# Patient Record
Sex: Male | Born: 1970 | Race: White | Hispanic: No | Marital: Married | Smoking: Former smoker
Health system: Southern US, Community
[De-identification: ages and names within clinical notes are randomized; demographics above are authoritative.]

## PROBLEM LIST (undated history)

## (undated) DIAGNOSIS — I7300 Raynaud's syndrome without gangrene: Secondary | ICD-10-CM

## (undated) DIAGNOSIS — I2510 Atherosclerotic heart disease of native coronary artery without angina pectoris: Secondary | ICD-10-CM

## (undated) DIAGNOSIS — E785 Hyperlipidemia, unspecified: Secondary | ICD-10-CM

## (undated) DIAGNOSIS — C801 Malignant (primary) neoplasm, unspecified: Secondary | ICD-10-CM

## (undated) DIAGNOSIS — C8590 Non-Hodgkin lymphoma, unspecified, unspecified site: Secondary | ICD-10-CM

## (undated) DIAGNOSIS — C181 Malignant neoplasm of appendix: Secondary | ICD-10-CM

## (undated) DIAGNOSIS — I739 Peripheral vascular disease, unspecified: Secondary | ICD-10-CM

## (undated) DIAGNOSIS — I10 Essential (primary) hypertension: Secondary | ICD-10-CM

## (undated) DIAGNOSIS — K5090 Crohn's disease, unspecified, without complications: Secondary | ICD-10-CM

## (undated) DIAGNOSIS — K219 Gastro-esophageal reflux disease without esophagitis: Secondary | ICD-10-CM

## (undated) DIAGNOSIS — B009 Herpesviral infection, unspecified: Secondary | ICD-10-CM

## (undated) HISTORY — PX: EXPLORATORY LAPAROTOMY: SUR591

## (undated) HISTORY — PX: SHOULDER ARTHROSCOPY W/ SUPERIOR LABRAL ANTERIOR POSTERIOR REPAIR: SHX2403

## (undated) HISTORY — DX: Hyperlipidemia, unspecified: E78.5

## (undated) HISTORY — DX: Atherosclerotic heart disease of native coronary artery without angina pectoris: I25.10

## (undated) HISTORY — DX: Gastro-esophageal reflux disease without esophagitis: K21.9

## (undated) HISTORY — PX: REFRACTIVE SURGERY: SHX103

---

## 2000-07-17 ENCOUNTER — Ambulatory Visit (HOSPITAL_COMMUNITY): Admission: RE | Admit: 2000-07-17 | Discharge: 2000-07-17 | Payer: Self-pay | Admitting: Orthopedic Surgery

## 2000-07-17 ENCOUNTER — Encounter: Payer: Self-pay | Admitting: Orthopedic Surgery

## 2009-06-04 ENCOUNTER — Ambulatory Visit: Payer: Self-pay | Admitting: Diagnostic Radiology

## 2009-06-04 ENCOUNTER — Emergency Department (HOSPITAL_BASED_OUTPATIENT_CLINIC_OR_DEPARTMENT_OTHER): Admission: EM | Admit: 2009-06-04 | Discharge: 2009-06-04 | Payer: Self-pay | Admitting: Emergency Medicine

## 2009-06-04 ENCOUNTER — Ambulatory Visit: Payer: Self-pay | Admitting: Vascular Surgery

## 2011-01-06 LAB — URINALYSIS, ROUTINE W REFLEX MICROSCOPIC
Bilirubin Urine: NEGATIVE
Glucose, UA: NEGATIVE mg/dL
Hgb urine dipstick: NEGATIVE
Ketones, ur: NEGATIVE mg/dL
Nitrite: NEGATIVE
Protein, ur: NEGATIVE mg/dL
Specific Gravity, Urine: 1.008 (ref 1.005–1.030)
Urobilinogen, UA: 0.2 mg/dL (ref 0.0–1.0)
pH: 5.5 (ref 5.0–8.0)

## 2011-01-06 LAB — CBC
HCT: 46.6 % (ref 39.0–52.0)
Hemoglobin: 16.1 g/dL (ref 13.0–17.0)
MCHC: 34.5 g/dL (ref 30.0–36.0)
MCV: 89 fL (ref 78.0–100.0)
Platelets: 349 10*3/uL (ref 150–400)
RBC: 5.24 MIL/uL (ref 4.22–5.81)
RDW: 12.3 % (ref 11.5–15.5)
WBC: 9.3 10*3/uL (ref 4.0–10.5)

## 2011-01-06 LAB — COMPREHENSIVE METABOLIC PANEL
ALT: 36 U/L (ref 0–53)
AST: 33 U/L (ref 0–37)
Albumin: 4.4 g/dL (ref 3.5–5.2)
Alkaline Phosphatase: 66 U/L (ref 39–117)
BUN: 14 mg/dL (ref 6–23)
CO2: 28 mEq/L (ref 19–32)
Calcium: 9.5 mg/dL (ref 8.4–10.5)
Chloride: 106 mEq/L (ref 96–112)
Creatinine, Ser: 1 mg/dL (ref 0.4–1.5)
GFR calc Af Amer: >60 mL/min (ref >60)
GFR calc non Af Amer: >60 mL/min (ref >60)
Glucose, Bld: 94 mg/dL (ref 70–99)
Potassium: 4.3 mEq/L (ref 3.5–5.1)
Sodium: 143 mEq/L (ref 135–145)
Total Bilirubin: 0.3 mg/dL (ref 0.3–1.2)
Total Protein: 7.6 g/dL (ref 6.0–8.3)

## 2011-01-06 LAB — URINE CULTURE
Colony Count: NO GROWTH
Culture: NO GROWTH

## 2011-01-06 LAB — DIFFERENTIAL
Basophils Absolute: 0 10*3/uL (ref 0.0–0.1)
Basophils Relative: 1 % (ref 0–1)
Eosinophils Absolute: 0.1 10*3/uL (ref 0.0–0.7)
Eosinophils Relative: 1 % (ref 0–5)
Lymphocytes Relative: 23 % (ref 12–46)
Lymphs Abs: 2.2 10*3/uL (ref 0.7–4.0)
Monocytes Absolute: 0.6 10*3/uL (ref 0.1–1.0)
Monocytes Relative: 7 % (ref 3–12)
Neutro Abs: 6.4 10*3/uL (ref 1.7–7.7)
Neutrophils Relative %: 68 % (ref 43–77)

## 2011-01-06 LAB — SEDIMENTATION RATE: Sed Rate: 5 mm/hr (ref 0–16)

## 2011-01-06 LAB — CULTURE, BLOOD (ROUTINE X 2)
Culture: NO GROWTH
Culture: NO GROWTH

## 2011-01-06 LAB — PROTIME-INR
INR: 0.9 (ref 0.00–1.49)
Prothrombin Time: 11.6 seconds (ref 11.6–15.2)

## 2011-02-14 NOTE — Consult Note (Signed)
NEW PATIENT CONSULTATION   Gilbert Pierce, Gilbert Pierce  DOB:  August 16, 1971                                       06/04/2009  ZDGLO#:[id number]   The patient presents today for evaluation of cyanotic changes in his  left hand.  He is a very active 40 year old gentleman who over the last  2 weeks initially noted numbness and tingling in his tips of his left  hand, the fingertips of his left hand and also an itching sensation.  Over the past several days this has now progressed to cyanotic changes  and pain.  He has no prior symptoms of this.  He does not have any prior  history of cardiac disease.  He was seen for a medical followup today  and referred for further evaluation.  He does not have any other  symptoms suggestive of embolic events, specifically transient ischemic  attacks or stroke.   PAST MEDICAL HISTORY:  His past history is significant for Crohn's  disease.  He does have a history of hypertension.   FAMILY HISTORY:  Negative for premature atherosclerotic disease.   SOCIAL HISTORY:  He is single.  He is in [employer].  He smokes half a pack  of cigarettes per day.  Occasional social alcohol consumption.   REVIEW OF SYSTEMS:  Weight is 218 pounds.  He is 5 feet and 5 inches  tall.  He has negative cardiac, pulmonary, GI difficulties aside from  his Crohn's.  He does not have any orthopedic issues.   MEDICATION ALLERGIES:  None.   CURRENT MEDICATIONS:  1. Cymbalta 60 mg daily.  2. Indocin as needed for pain in his hands.   PHYSICAL EXAMINATION:  A well-developed, well-nourished white male  appearing his stated age of 40.  He does have palpable radial and  brachial pulses bilaterally.  His heart is regular rate and rhythm.  He  does not have subclavian bruits.  The right hand is normal.  In the left  hand he does have cyanotic changes over the tips most prominently the  fourth and the fifth finger.   He underwent noninvasive vascular laboratory studies in our  office.  This reveals completely normal flow down to the level of the wrist  bilaterally with normal pressures and waveforms bilaterally.  He does  have dampened waveforms most prominently in the first and fourth digits  of the left arm.   I discussed this at length with the patient.  I explained that he could  either have in situ occlusion of these vessels which would be quite  unusual versus embolic phenomenon.  He does not have any repetitive  motion type job where he would be concerned about in situ thrombosis.  I  have recommended that we proceed with echocardiogram to rule out cardiac  source which I suspect will be negative.  If this is negative I would  recommend formal arteriogram of his arch and left arm and left hand to  rule out any other correctable source of this.  I explained that this  treatment specifically for the cyanotic and painful hand would be  expectant with, do not feel that he would have any tissue loss.  We will  obtain a 2-D echocardiogram and if this is negative he will undergo an  arteriogram for further evaluation.   Larina Earthly, M.D.  Electronically Signed  TFE/MEDQ  D:  06/04/2009  T:  06/05/2009  Job:  [id number]   cc:   April Palumbo-Rasch, MD

## 2012-03-04 ENCOUNTER — Encounter (HOSPITAL_COMMUNITY): Payer: Self-pay

## 2012-03-04 ENCOUNTER — Emergency Department (HOSPITAL_COMMUNITY)
Admission: EM | Admit: 2012-03-04 | Discharge: 2012-03-05 | Disposition: A | Discharge status: Home / Self Care | Payer: BC Managed Care – PPO | Attending: Emergency Medicine | Admitting: Emergency Medicine

## 2012-03-04 DIAGNOSIS — K5090 Crohn's disease, unspecified, without complications: Secondary | ICD-10-CM | POA: Insufficient documentation

## 2012-03-04 DIAGNOSIS — M549 Dorsalgia, unspecified: Secondary | ICD-10-CM

## 2012-03-04 HISTORY — DX: Crohn's disease, unspecified, without complications: K50.90

## 2012-03-04 LAB — BASIC METABOLIC PANEL
BUN: 13 mg/dL (ref 6–23)
CO2: 26 mEq/L (ref 19–32)
Calcium: 9.4 mg/dL (ref 8.4–10.5)
Chloride: 103 mEq/L (ref 96–112)
Creatinine, Ser: 1.02 mg/dL (ref 0.50–1.35)
GFR calc Af Amer: >90 mL/min (ref >90)
GFR calc non Af Amer: 89 mL/min — ABNORMAL LOW (ref >90)
Glucose, Bld: 100 mg/dL — ABNORMAL HIGH (ref 70–99)
Potassium: 4.1 mEq/L (ref 3.5–5.1)
Sodium: 139 mEq/L (ref 135–145)

## 2012-03-04 LAB — CBC
HCT: 45.5 % (ref 39.0–52.0)
Hemoglobin: 15.6 g/dL (ref 13.0–17.0)
MCH: 28.9 pg (ref 26.0–34.0)
MCHC: 34.3 g/dL (ref 30.0–36.0)
MCV: 84.3 fL (ref 78.0–100.0)
Platelets: 309 10*3/uL (ref 150–400)
RBC: 5.4 MIL/uL (ref 4.22–5.81)
RDW: 12.9 % (ref 11.5–15.5)
WBC: 9.5 10*3/uL (ref 4.0–10.5)

## 2012-03-04 MED ORDER — HYDROMORPHONE HCL PF 2 MG/ML IJ SOLN: 2.0000 mg | Freq: Once | INTRAMUSCULAR | Status: DC

## 2012-03-04 MED ORDER — DIAZEPAM 5 MG PO TABS
5.0000 mg | ORAL_TABLET | Freq: Once | ORAL | Status: AC
  Administered 2012-03-04: 5 mg via ORAL
  Filled 2012-03-04: qty 1

## 2012-03-04 MED ORDER — HYDROMORPHONE HCL PF 2 MG/ML IJ SOLN
2.0000 mg | Freq: Once | INTRAMUSCULAR | Status: AC
  Administered 2012-03-04: 2 mg via INTRAVENOUS
  Filled 2012-03-04: qty 1

## 2012-03-04 MED ORDER — HYDROMORPHONE HCL PF 2 MG/ML IJ SOLN
2.0000 mg | Freq: Once | INTRAMUSCULAR | Status: AC
  Administered 2012-03-04: 2 mg via INTRAMUSCULAR
  Filled 2012-03-04: qty 1

## 2012-03-04 MED ORDER — ONDANSETRON 4 MG PO TBDP
4.0000 mg | ORAL_TABLET | Freq: Once | ORAL | Status: AC
  Administered 2012-03-04: 4 mg via ORAL
  Filled 2012-03-04: qty 1

## 2012-03-04 MED ORDER — OXYCODONE-ACETAMINOPHEN 5-325 MG PO TABS
1.0000 | ORAL_TABLET | ORAL | Status: AC | PRN
Start: 2012-03-04 — End: 2012-03-14

## 2012-03-04 MED ORDER — DIAZEPAM 5 MG PO TABS: 5.0000 mg | ORAL_TABLET | Freq: Three times a day (TID) | ORAL | Status: AC | PRN

## 2012-03-04 NOTE — ED Notes (Signed)
Pt complains of lower back pain since noon today, no injury noted

## 2012-03-04 NOTE — Discharge Instructions (Signed)
Back Pain, Adult Low back pain is very common. About 1 in 5 people have back pain.The cause of low back pain is rarely dangerous. The pain often gets better over time.About half of people with a sudden onset of back pain feel better in just 2 weeks. About 8 in 10 people feel better by 6 weeks.  CAUSES Some common causes of back pain include:  Strain of the muscles or ligaments supporting the spine.   Wear and tear (degeneration) of the spinal discs.   Arthritis.   Direct injury to the back.  DIAGNOSIS Most of the time, the direct cause of low back pain is not known.However, back pain can be treated effectively even when the exact cause of the pain is unknown.Answering your caregiver's questions about your overall health and symptoms is one of the most accurate ways to make sure the cause of your pain is not dangerous. If your caregiver needs more information, he or she may order lab work or imaging tests (X-rays or MRIs).However, even if imaging tests show changes in your back, this usually does not require surgery. HOME CARE INSTRUCTIONS For many people, back pain returns.Since low back pain is rarely dangerous, it is often a condition that people can learn to manageon their own.   Remain active. It is stressful on the back to sit or stand in one place. Do not sit, drive, or stand in one place for more than 30 minutes at a time. Take short walks on level surfaces as soon as pain allows.Try to increase the length of time you walk each day.   Do not stay in bed.Resting more than 1 or 2 days can delay your recovery.   Do not avoid exercise or work.Your body is made to move.It is not dangerous to be active, even though your back may hurt.Your back will likely heal faster if you return to being active before your pain is gone.   Pay attention to your body when you bend and lift. Many people have less discomfortwhen lifting if they bend their knees, keep the load close to their  bodies,and avoid twisting. Often, the most comfortable positions are those that put less stress on your recovering back.   Find a comfortable position to sleep. Use a firm mattress and lie on your side with your knees slightly bent. If you lie on your back, put a pillow under your knees.   Only take over-the-counter or prescription medicines as directed by your caregiver. Over-the-counter medicines to reduce pain and inflammation are often the most helpful.Your caregiver may prescribe muscle relaxant drugs.These medicines help dull your pain so you can more quickly return to your normal activities and healthy exercise.   Put ice on the injured area.   Put ice in a plastic bag.   Place a towel between your skin and the bag.   Leave the ice on for 15 to 20 minutes, 3 to 4 times a day for the first 2 to 3 days. After that, ice and heat may be alternated to reduce pain and spasms.   Ask your caregiver about trying back exercises and gentle massage. This may be of some benefit.   Avoid feeling anxious or stressed.Stress increases muscle tension and can worsen back pain.It is important to recognize when you are anxious or stressed and learn ways to manage it.Exercise is a great option.  SEEK MEDICAL CARE IF:  You have pain that is not relieved with rest or medicine.   You have   pain that does not improve in 1 week.   You have new symptoms.   You are generally not feeling well.  SEEK IMMEDIATE MEDICAL CARE IF:   You have pain that radiates from your back into your legs.   You develop new bowel or bladder control problems.   You have unusual weakness or numbness in your arms or legs.   You develop nausea or vomiting.   You develop abdominal pain.   You feel faint.  Document Released: 09/18/2005 Document Revised: 09/07/2011 Document Reviewed: 02/06/2011 [employer] Patient Information 2012 [employer]. 

## 2012-03-04 NOTE — ED Provider Notes (Signed)
History     CSN: [id number]  Arrival date & time 03/04/12  1914   First MD Initiated Contact with Patient 03/04/12 2001      Chief Complaint  Patient presents with  . Back Pain    (Consider location/radiation/quality/duration/timing/severity/associated sxs/prior treatment) HPI  Patient reasons to the emergency department with complaints of back pain. He states the pain started at noon today and has increasingly been getting worse. His pain has gotten to the point where he cannot walk very well. He denies having saddle anesthesia, loss of urine or bowel control, numbness or tingling in the legs, IV drug use. He denies having nausea, diarrhea, fevers, weakness. He states that the back pain associated. No matter what position he gets that he cannot get relief. The patient is in no acute distress at the time and vital signs are stable.  Past Medical History  Diagnosis Date  . Crohn disease     History reviewed. No pertinent past surgical history.  History reviewed. No pertinent family history.  History  Substance Use Topics  . Smoking status: Not on file  . Smokeless tobacco: Not on file  . Alcohol Use: Yes      Review of Systems  Allergies  Review of patient's allergies indicates no known allergies.  Home Medications   Current Outpatient Rx  Name Route Sig Dispense Refill  . CETIRIZINE HCL 10 MG PO TABS Oral Take 10 mg by mouth daily.    . OMEGA-3 FATTY ACIDS 1000 MG PO CAPS Oral Take 2 g by mouth daily.    Marland Kitchen LANSOPRAZOLE 15 MG PO CPDR Oral Take 15 mg by mouth daily.    Marland Kitchen MESALAMINE 1.2 G PO TBEC Oral Take 1,200 mg by mouth daily with breakfast.    . TADALAFIL 10 MG PO TABS Oral Take 10 mg by mouth daily as needed. E.D.      BP 153/97  Pulse 88  Temp(Src) 98.7 F (37.1 C) (Oral)  Resp 18  SpO2 95%  Physical Exam  Nursing note and vitals reviewed. Constitutional: He appears well-developed and well-nourished. No distress.  HENT:  Head: Normocephalic and  atraumatic.  Eyes: Pupils are equal, round, and reactive to light.  Neck: Normal range of motion. Neck supple.  Cardiovascular: Normal rate and regular rhythm.   Pulmonary/Chest: Effort normal.  Abdominal: Soft.  Musculoskeletal:       Lumbar back: He exhibits pain and spasm.        Equal strength to bilateral lower extremities. Neurosensory function adequate to both legs.  Skin color is normal. Skin is warm and moist. I see no step off deformity, no bony tenderness.   Pt is not able to ambulate at the time. Pain is not relieved when sitting in any position.   ROM is decreased due to pain. No crepitus, laceration, effusion, swelling.  Pulses are normal   Neurological: He is alert.  Skin: Skin is warm and dry.    ED Course  Procedures (including critical care time)   Labs Reviewed  CBC  BASIC METABOLIC PANEL   No results found.   No diagnosis found.    MDM  patient given 2 mg of IM Dilaudid. I waited 30 minutes and the patient had very minimal relief. He is still not able to ambulate due to the significant pain.  I have discussed patient with Dr. Patria Mane. IV placed in the patient's arm, 2mg  IV Dilaudid ordered, patient moved back to exam room, labs ordered Dr. Patria Mane has agreed  to assume care of patient.        Dorthula Matas, PA 03/05/12 0001

## 2012-03-04 NOTE — ED Notes (Signed)
Pt reports back pain that started approximately six hours ago. Pt states she pain has been gradually increasing. Pt denies any heavy lifting, any sudden pain when bending down or movement. Pt states he took one of his wife's flexeril with minor relief. Pt states the medication just "dulled the knife in his back."

## 2012-03-05 ENCOUNTER — Other Ambulatory Visit: Payer: Self-pay | Admitting: Orthopedic Surgery

## 2012-03-05 DIAGNOSIS — M545 Low back pain, unspecified: Secondary | ICD-10-CM

## 2012-03-05 NOTE — ED Notes (Signed)
Rx given x2 D/c instructions reviewed w/ pt and family - pt and family deny any further questions or concerns at present. Pt ambulating independently w/ steady gait on d/c in no acute distress, A&Ox4.  

## 2012-03-05 NOTE — ED Provider Notes (Signed)
Medical screening examination/treatment/procedure(s) were conducted as a shared visit with non-physician practitioner(s) and myself.  I personally evaluated the patient during the encounter  Normal lower extremity neurologic exam. No bowel or bladder complaints. No back pain red flags. Likely musculoskeletal back pain. Doubt spinal epidural abscess. Doubt cauda equina. Doubt abdominal aortic aneurysm  12:01 AM The patient feels much better at this time.  He'll be discharged home with followup with his orthopedic surgeon.  He understands to return to the ER for new or worsening symptoms including development of numbness or weakness in his legs, bowel or bladder incontinence, perineal numbness, fevers  1. Back pain      Lyanne Co, MD 03/05/12 0002

## 2012-03-06 ENCOUNTER — Ambulatory Visit
Admission: RE | Admit: 2012-03-06 | Discharge: 2012-03-06 | Disposition: A | Discharge status: Home / Self Care | Payer: BC Managed Care – PPO | Source: Ambulatory Visit | Attending: Orthopedic Surgery | Admitting: Orthopedic Surgery

## 2012-03-06 DIAGNOSIS — M545 Low back pain, unspecified: Secondary | ICD-10-CM

## 2015-02-16 ENCOUNTER — Encounter (HOSPITAL_COMMUNITY): Payer: Self-pay | Admitting: Emergency Medicine

## 2015-02-16 ENCOUNTER — Emergency Department (HOSPITAL_COMMUNITY)
Admission: EM | Admit: 2015-02-16 | Discharge: 2015-02-16 | Disposition: A | Discharge status: Home / Self Care | Payer: BLUE CROSS/BLUE SHIELD | Attending: Emergency Medicine | Admitting: Emergency Medicine

## 2015-02-16 ENCOUNTER — Emergency Department (HOSPITAL_COMMUNITY): Payer: BLUE CROSS/BLUE SHIELD

## 2015-02-16 DIAGNOSIS — Z7982 Long term (current) use of aspirin: Secondary | ICD-10-CM | POA: Diagnosis not present

## 2015-02-16 DIAGNOSIS — Z79899 Other long term (current) drug therapy: Secondary | ICD-10-CM | POA: Insufficient documentation

## 2015-02-16 DIAGNOSIS — Z8719 Personal history of other diseases of the digestive system: Secondary | ICD-10-CM | POA: Insufficient documentation

## 2015-02-16 DIAGNOSIS — I10 Essential (primary) hypertension: Secondary | ICD-10-CM | POA: Diagnosis not present

## 2015-02-16 DIAGNOSIS — Z859 Personal history of malignant neoplasm, unspecified: Secondary | ICD-10-CM | POA: Insufficient documentation

## 2015-02-16 DIAGNOSIS — M79642 Pain in left hand: Secondary | ICD-10-CM | POA: Diagnosis present

## 2015-02-16 DIAGNOSIS — I739 Peripheral vascular disease, unspecified: Secondary | ICD-10-CM | POA: Diagnosis not present

## 2015-02-16 DIAGNOSIS — Z87891 Personal history of nicotine dependence: Secondary | ICD-10-CM | POA: Insufficient documentation

## 2015-02-16 HISTORY — DX: Raynaud's syndrome without gangrene: I73.00

## 2015-02-16 HISTORY — DX: Essential (primary) hypertension: I10

## 2015-02-16 HISTORY — DX: Malignant (primary) neoplasm, unspecified: C80.1

## 2015-02-16 LAB — CBC WITH DIFFERENTIAL/PLATELET
Basophils Absolute: 0 10*3/uL (ref 0.0–0.1)
Basophils Relative: 0 % (ref 0–1)
Eosinophils Absolute: 0.2 10*3/uL (ref 0.0–0.7)
Eosinophils Relative: 2 % (ref 0–5)
HCT: 48 % (ref 39.0–52.0)
Hemoglobin: 15.9 g/dL (ref 13.0–17.0)
Lymphocytes Relative: 35 % (ref 12–46)
Lymphs Abs: 3.3 10*3/uL (ref 0.7–4.0)
MCH: 29.7 pg (ref 26.0–34.0)
MCHC: 33.1 g/dL (ref 30.0–36.0)
MCV: 89.6 fL (ref 78.0–100.0)
Monocytes Absolute: 0.9 10*3/uL (ref 0.1–1.0)
Monocytes Relative: 10 % (ref 3–12)
Neutro Abs: 5.1 10*3/uL (ref 1.7–7.7)
Neutrophils Relative %: 53 % (ref 43–77)
Platelets: 246 10*3/uL (ref 150–400)
RBC: 5.36 MIL/uL (ref 4.22–5.81)
RDW: 13.1 % (ref 11.5–15.5)
WBC: 9.6 10*3/uL (ref 4.0–10.5)

## 2015-02-16 LAB — BASIC METABOLIC PANEL
Anion gap: 9 (ref 5–15)
BUN: 18 mg/dL (ref 6–20)
CO2: 26 mmol/L (ref 22–32)
Calcium: 9.3 mg/dL (ref 8.9–10.3)
Chloride: 102 mmol/L (ref 101–111)
Creatinine, Ser: 1.32 mg/dL — ABNORMAL HIGH (ref 0.61–1.24)
GFR calc Af Amer: >60 mL/min (ref >60)
GFR calc non Af Amer: >60 mL/min (ref >60)
Glucose, Bld: 88 mg/dL (ref 65–99)
Potassium: 5.6 mmol/L — ABNORMAL HIGH (ref 3.5–5.1)
Sodium: 137 mmol/L (ref 135–145)

## 2015-02-16 LAB — C-REACTIVE PROTEIN: CRP: 0.7 mg/dL (ref <1.0)

## 2015-02-16 LAB — SEDIMENTATION RATE: Sed Rate: 2 mm/hr (ref 0–16)

## 2015-02-16 MED ORDER — HYDROMORPHONE HCL 1 MG/ML IJ SOLN
1.0000 mg | Freq: Once | INTRAMUSCULAR | Status: AC
  Administered 2015-02-16: 1 mg via INTRAVENOUS
  Filled 2015-02-16: qty 1

## 2015-02-16 MED ORDER — ASPIRIN 325 MG PO TABS
325.0000 mg | ORAL_TABLET | Freq: Once | ORAL | Status: AC
  Administered 2015-02-16: 325 mg via ORAL
  Filled 2015-02-16: qty 1

## 2015-02-16 MED ORDER — FAMCICLOVIR 250 MG PO TABS: 1000.0000 mg | ORAL_TABLET | Freq: Two times a day (BID) | ORAL | Status: DC

## 2015-02-16 MED ORDER — IOHEXOL 350 MG/ML SOLN
125.0000 mL | Freq: Once | INTRAVENOUS | Status: AC | PRN
  Administered 2015-02-16: 100 mL via INTRAVENOUS

## 2015-02-16 MED ORDER — OXYCODONE-ACETAMINOPHEN 5-325 MG PO TABS: 1.0000 | ORAL_TABLET | ORAL | Status: DC | PRN

## 2015-02-16 NOTE — Progress Notes (Signed)
EDCM spoke to patient and his wife at bedside.  Patient confirms he has [facility].  Patient reports he is looking for another pcp.  Patient's wife reports her pcp is referring the patient to [facility] for primary care.  Patient and patient's wife politely declined  list of pcps  Who accept [facility].  No further EDCM needs at this time.

## 2015-02-16 NOTE — ED Notes (Signed)
Patient in radiology

## 2015-02-16 NOTE — ED Provider Notes (Signed)
CSN: [id number]     Arrival date & time 02/16/15  1400 History   First MD Initiated Contact with Patient 02/16/15 1508     Chief Complaint  Patient presents with  . Hand Pain    Left middle   HPI   23 YOM with a history  of Raynauds, HTN, hyperlipidemia, former smoker, presents today with pain and discoloration to his left hand and fingers. Pt reports that he was digagnosed with reynauds in 2010 with symptoms of intermittent discoloration of fingers and hand (white/purple) pain, and cold sensation. He reports the symptoms are intermittent and associated with periods of stress and exposure to cold. He reports minimal complaints until about 6 weeks ago when he had a return of symptoms. He states his fingertips turned white and purple, became cool to the touch, and extremely painful. He reports symptoms improved but did not return to baseline. He notes that 1 week after he noticed dark "black" discoloration to nailfold of left 3rd digit that continued to progress to hits current state; also noted splinter hemorrages. He states that the pain at baseline is 7/10 and increases at night' attempts at keeping fingers warm with gloves and covered has not improved symptoms. Pt denies any associated symptoms including fever, chills, n/v/d, chest pain, abd pain, or any other involvement including feet and toes. He reports that he is not currently taking daily therapy for symptoms. Pt additional notes a coldsore on lower lip, history of the same, currently using acyclovir for therapy. No history of skin infection including MRSA.     Past Medical History  Diagnosis Date  . Crohn disease   . Raynaud's disease   . Cancer   . Hypertension    Past Surgical History  Procedure Laterality Date  . Exploratory laparotomy    . Shoulder arthroscopy w/ superior labral anterior posterior repair     History reviewed. No pertinent family history. History  Substance Use Topics  . Smoking status: Not on file  .  Smokeless tobacco: Not on file  . Alcohol Use: Yes    Review of Systems  All other systems reviewed and are negative.  Allergies  Review of patient's allergies indicates no known allergies.  Home Medications   Prior to Admission medications   Medication Sig Start Date End Date Taking? Authorizing Provider  Adalimumab 40 MG/0.8ML PSKT Inject 40 mg into the skin every 14 (fourteen) days.   Yes Historical Provider, MD  amLODipine (NORVASC) 5 MG tablet Take 5 mg by mouth daily. 11/18/14  Yes Historical Provider, MD  aspirin EC 81 MG tablet Take 81 mg by mouth daily. 01/20/15 01/20/16 Yes Historical Provider, MD  cetirizine (ZYRTEC) 10 MG tablet Take 10 mg by mouth daily as needed for allergies.    Yes Historical Provider, MD  Cinnamon 500 MG TABS Take 1 tablet by mouth daily.   Yes Historical Provider, MD  fenofibrate (TRICOR) 145 MG tablet Take 145 mg by mouth daily. 12/23/14 12/23/15 Yes Historical Provider, MD  fish oil-omega-3 fatty acids 1000 MG capsule Take 2 g by mouth daily.   Yes Historical Provider, MD  Ginkgo Biloba Extract 60 MG CAPS Take 1 capsule by mouth daily.   Yes Historical Provider, MD  lansoprazole (PREVACID) 15 MG capsule Take 15 mg by mouth daily.   Yes Historical Provider, MD  lisinopril (PRINIVIL,ZESTRIL) 20 MG tablet Take 20 mg by mouth daily. 12/23/14  Yes Historical Provider, MD  omeprazole (PRILOSEC) 40 MG capsule Take 40 mg by mouth  daily. 11/16/14  Yes Historical Provider, MD  tadalafil (CIALIS) 10 MG tablet Take 10 mg by mouth daily as needed. E.D.   Yes Historical Provider, MD   BP 144/78 mmHg  Pulse 74  Temp(Src) 98.2 F (36.8 C) (Oral)  Resp 18  SpO2 98% Physical Exam  Constitutional: He is oriented to person, place, and time. He appears well-developed and well-nourished.  HENT:  Head: Normocephalic and atraumatic.  Eyes: Conjunctivae are normal. Pupils are equal, round, and reactive to light. Right eye exhibits no discharge. Left eye exhibits no  discharge. No scleral icterus.  Neck: Normal range of motion. No JVD present. No tracheal deviation present.  Cardiovascular: Normal rate and regular rhythm.   Pulmonary/Chest: Effort normal. No stridor.  Musculoskeletal:  See attached photos. Decreased sensation to the second third and fourth digits of the left hand. Full range of motion  Neurological: He is alert and oriented to person, place, and time. Coordination normal.  Psychiatric: He has a normal mood and affect. His behavior is normal. Judgment and thought content normal.  Nursing note and vitals reviewed.              ED Course  Procedures (including critical care time) Labs Review Labs Reviewed - No data to display  Imaging Review Ct Angio Up Extrem Left W/cm &/or Wo/cm  02/16/2015   CLINICAL DATA:  Progression of Raynaud's syndrome with ischemia of the left third finger tip.  EXAM: CT ANGIOGRAPHY OF THE LEFT UPPER EXTREMITY  TECHNIQUE: Multidetector CT imaging of the left upper extremity and upper chest was performed using the standard protocol during bolus administration of intravenous contrast. Multiplanar CT image reconstructions and MIPs were obtained to evaluate the vascular anatomy.  CONTRAST:  179mL OMNIPAQUE IOHEXOL 350 MG/ML SOLN  COMPARISON:  None.  FINDINGS: The aortic arch and proximal great vessels show normal patency. Proximal arterial supply of the left upper extremity is normally patent including the left subclavian, axillary and brachial arteries. The brachial artery shows a normal trifurcation into radial, ulnar and interosseous arteries. These arteries are widely patent to the level of the wrist with dominant radial supply to the hand. The distal ulnar artery becomes fairly diminutive in the hand and there is a dominant deep arch present. Resolution at the level of the digital arteries is not adequate to assess for digital occlusions. No aneurysmal disease is identified.  No mass lesions or enlarged lymph  nodes are seen. No abnormal fluid collections are identified. Visualized chest shows no evidence of masses, enlarged lymph nodes or pulmonary abnormalities. Venous patency is not evaluated on this study. No evidence of inflammatory process. Bony structures are within normal limits.  Review of the MIP images confirms the above findings.  IMPRESSION: No significant arterial occlusive disease identified by CTA. Arteries are adequately assessed to the level of the palmar level and are widely patent. Clinical ischemia is presumably due to occlusive disease at the level of digital arteries which are not adequately assessed by CTA.   Electronically Signed   By: Aletta Edouard M.D.   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 Likely due to pronounced exacerbation. She was treated with pain medication here in ED, CT imaging done to rule out upper extremity clots or obstruction. This likely represents a small vascular changes, vascular surgery follow-up tomorrow along with orthopedics. Patient given prescription for pain medication and strict precautions.        Okey Regal, PA-C 02/17/15 [place], MD 02/17/15 [phone]

## 2015-02-16 NOTE — ED Notes (Signed)
Pt has hx of Raynauds syndrome that has been managed with his doctor. States over the last 6 weeks his fingers have been getting worse and worse with nail beds turning white with splinter hemorrhaging in fingernails. States over the last three days the tip of his middle finger on the left hand has began to turn black, with redness of his left palm. Denies N/V/D, fever/chills.

## 2015-02-16 NOTE — Discharge Instructions (Signed)
Please follow up with vascular surgery tomorrow at 2:45 PM, contact information attached. Please monitor for worsening signs or symptoms to return immediately at the present.

## 2015-02-17 ENCOUNTER — Encounter: Payer: Self-pay | Admitting: Vascular Surgery

## 2015-02-17 ENCOUNTER — Ambulatory Visit (INDEPENDENT_AMBULATORY_CARE_PROVIDER_SITE_OTHER): Payer: BLUE CROSS/BLUE SHIELD | Admitting: Vascular Surgery

## 2015-02-17 ENCOUNTER — Other Ambulatory Visit: Payer: Self-pay | Admitting: *Deleted

## 2015-02-17 ENCOUNTER — Encounter: Payer: Self-pay | Admitting: *Deleted

## 2015-02-17 VITALS — BP 129/79 | HR 83 | Ht 71.0 in | Wt 225.0 lb

## 2015-02-17 DIAGNOSIS — I7300 Raynaud's syndrome without gangrene: Secondary | ICD-10-CM

## 2015-02-17 NOTE — Progress Notes (Signed)
[facility] [place]  Patient name: Gilbert Pierce MRN: [id number] DOB: [birth year] Sex: male  REASON FOR CONSULT: Ischemic fingers left hand  HPI: Gilbert Pierce is a 44 y.o. male who is right-handed. He is developed ischemic changes to his left middle index and small finger. He states that this is happened before and that these changes usually resolve. 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 At that time, a rheumatologist diagnosed him with Raynaud's involving the left upper extremity. He states that he occasionally develops some bluish discoloration of his fingertips which is made worse by cold temperature. His most recent symptoms began about 6 weeks ago. He's had some persistent discoloration of the distal aspect of his right middle finger and also some petechial hemorrhages under the left index finger. His symptoms actually started in the left small finger.   He is right-handed. There is no history of frostbite in the past. 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 He has never lived in cold environment except for when he was younger in [place] for a few years.   Past Medical History  Diagnosis Date  . Crohn disease   . Raynaud's disease   . Cancer   . Hypertension    Family History  Problem Relation Age of Onset  . Cancer Mother   . Heart disease Mother   . Hyperlipidemia Mother   . Hypertension Mother   . Heart disease Father   . Hyperlipidemia Father   . Hypertension Father    SOCIAL HISTORY: History  Substance Use Topics  . Smoking status: Former Smoker    Start date: 02/16/2013  . Smokeless tobacco: Not on file  . Alcohol Use: 1.8 - 3.6 oz/week    1-2 Glasses of wine, 1-2 Cans of beer, 1-2 Shots of liquor per week   No Known Allergies Current Outpatient Prescriptions  Medication Sig Dispense Refill  . Adalimumab 40 MG/0.8ML PSKT  Inject 40 mg into the skin every 14 (fourteen) days.    Marland Kitchen amLODipine (NORVASC) 5 MG tablet Take 5 mg by mouth daily.    Marland Kitchen aspirin EC 81 MG tablet Take 81 mg by mouth daily.    . cetirizine (ZYRTEC) 10 MG tablet Take 10 mg by mouth daily as needed for allergies.     . Cinnamon 500 MG TABS Take 1 tablet by mouth daily.    . famciclovir (FAMVIR) 250 MG tablet Take 4 tablets (1,000 mg total) by mouth 2 (two) times daily. Please take 1000 mg twice a day for 1 day 30 tablet 0  . fenofibrate (TRICOR) 145 MG tablet Take 145 mg by mouth daily.    . fish oil-omega-3 fatty acids 1000 MG capsule Take 2 g by mouth daily.    . Ginkgo Biloba Extract 60 MG CAPS Take 1 capsule by mouth daily.    . lansoprazole (PREVACID) 15 MG capsule Take 15 mg by mouth daily.    Marland Kitchen lisinopril (PRINIVIL,ZESTRIL) 20 MG tablet Take 20 mg by mouth daily.    Marland Kitchen omeprazole (PRILOSEC) 40 MG capsule Take 40 mg by mouth daily.    Marland Kitchen oxyCODONE-acetaminophen (PERCOCET/ROXICET) 5-325 MG per tablet Take 1-2 tablets by mouth every 4 (four) hours as needed for severe pain. 20 tablet 0  . tadalafil (CIALIS) 10 MG tablet Take 10 mg by mouth daily as needed. E.D.     No current facility-administered  medications for this visit.   REVIEW OF SYSTEMS: Valu.Nieves ] denotes positive finding; [  ] denotes negative finding  CARDIOVASCULAR:  [ ]  chest pain   [ ]  chest pressure   [ ]  palpitations   [ ]  orthopnea   [ ]  dyspnea on exertion   [ ]  claudication   [ ]  rest pain   [ ]  DVT   [ ]  phlebitis PULMONARY:   [ ]  productive cough   [ ]  asthma   [ ]  wheezing NEUROLOGIC:   [ ]  weakness  [ ]  paresthesias  [ ]  aphasia  [ ]  amaurosis  [ ]  dizziness HEMATOLOGIC:   [ ]  bleeding problems   [ ]  clotting disorders MUSCULOSKELETAL:  [ ]  joint pain   [ ]  joint swelling [ ]  leg swelling GASTROINTESTINAL: [ ]   blood in stool  [ ]   hematemesis GENITOURINARY:  [ ]   dysuria  [ ]   hematuria PSYCHIATRIC:  [ ]  history of major depression INTEGUMENTARY:  [ ]  rashes  [ ]   ulcers CONSTITUTIONAL:  [ ]  fever   [ ]  chills  PHYSICAL EXAM: Filed Vitals:   02/17/15 1522  BP: 129/79  Pulse: 83  Height: 5\' 11"  (1.803 m)  Weight: 225 lb (102.059 kg)  SpO2: 99%   GENERAL: The patient is a well-nourished male, in no acute distress. The vital signs are documented above. CARDIOVASCULAR: There is a regular rate and rhythm. I do not detect carotid bruits. He has palpable brachial, radial, and ulnar pulses bilaterally. He has a positive Allen test bilaterally PULMONARY: There is good air exchange bilaterally without wheezing or rales. ABDOMEN: Soft and non-tender with normal pitched bowel sounds.  MUSCULOSKELETAL: There are no major deformities or cyanosis. NEUROLOGIC: No focal weakness or paresthesias are detected. SKIN: He has some purplish discoloration of the distal aspect of his left middle finger. There are some petechial hemorrhages under the left index finger nailbed. The left small finger where he initially had symptoms appears adequately perfused. PSYCHIATRIC: The patient has a normal affect.  DATA:  I reviewed his CT angiogram which shows no significant arterial occlusive disease to the level of the palmar arch. The digital arteries could not be adequately assessed.  MEDICAL ISSUES:  HISTORY OF RAYNAUD'S SYNDROME: The patient has ischemic changes of the middle and index finger of the left hand. There is no evidence of large vessel disease with palpable radial and ulnar pulses bilaterally. CT angiogram showed no significant large vessel disease. The Palmar Arch could not be visualized and I have recommended we proceed with an arteriogram to further evaluate the palmar arch. In addition arrangements are being made for him to see Dr. Amedeo Plenty at [facility]. He has not seen a rheumatologist since 2010 when he was originally diagnosed with Raynaud's syndrome. His arteriogram is scheduled for 02/22/2015. We have discussed the indications for the procedure and  the potential complications including but not limited to leading, arterial injury, or diarrhea. All his questions were answered and he is agreeable to proceed.  Deitra Mayo [facility] [place]: [phone]

## 2015-02-22 ENCOUNTER — Encounter (HOSPITAL_COMMUNITY): Payer: Self-pay | Admitting: Vascular Surgery

## 2015-02-22 ENCOUNTER — Encounter (HOSPITAL_COMMUNITY)
Admission: RE | Disposition: A | Discharge status: Home / Self Care | Payer: BLUE CROSS/BLUE SHIELD | Source: Ambulatory Visit | Attending: Vascular Surgery

## 2015-02-22 ENCOUNTER — Ambulatory Visit (HOSPITAL_COMMUNITY)
Admission: RE | Admit: 2015-02-22 | Discharge: 2015-02-22 | Disposition: A | Discharge status: Home / Self Care | Payer: BLUE CROSS/BLUE SHIELD | Source: Ambulatory Visit | Attending: Vascular Surgery | Admitting: Vascular Surgery

## 2015-02-22 DIAGNOSIS — K5090 Crohn's disease, unspecified, without complications: Secondary | ICD-10-CM | POA: Diagnosis not present

## 2015-02-22 DIAGNOSIS — Z87891 Personal history of nicotine dependence: Secondary | ICD-10-CM | POA: Diagnosis not present

## 2015-02-22 DIAGNOSIS — I7300 Raynaud's syndrome without gangrene: Secondary | ICD-10-CM | POA: Diagnosis present

## 2015-02-22 DIAGNOSIS — Z7982 Long term (current) use of aspirin: Secondary | ICD-10-CM | POA: Insufficient documentation

## 2015-02-22 DIAGNOSIS — I10 Essential (primary) hypertension: Secondary | ICD-10-CM | POA: Diagnosis not present

## 2015-02-22 DIAGNOSIS — I998 Other disorder of circulatory system: Secondary | ICD-10-CM | POA: Diagnosis not present

## 2015-02-22 HISTORY — PX: ULTRASOUND GUIDANCE FOR VASCULAR ACCESS: SHX6516

## 2015-02-22 HISTORY — PX: PERIPHERAL VASCULAR CATHETERIZATION: SHX172C

## 2015-02-22 HISTORY — PX: ARCH AORTOGRAM: SHX5501

## 2015-02-22 LAB — POCT I-STAT, CHEM 8
BUN: 26 mg/dL — ABNORMAL HIGH (ref 6–20)
Calcium, Ion: 1.14 mmol/L (ref 1.12–1.23)
Chloride: 105 mmol/L (ref 101–111)
Creatinine, Ser: 1.2 mg/dL (ref 0.61–1.24)
Glucose, Bld: 105 mg/dL — ABNORMAL HIGH (ref 65–99)
HCT: 44 % (ref 39.0–52.0)
Hemoglobin: 15 g/dL (ref 13.0–17.0)
Potassium: 4.3 mmol/L (ref 3.5–5.1)
Sodium: 140 mmol/L (ref 135–145)
TCO2: 22 mmol/L (ref 0–100)

## 2015-02-22 SURGERY — UPPER EXTREMITY ANGIOGRAPHY
Anesthesia: LOCAL | Laterality: Right

## 2015-02-22 MED ORDER — OXYCODONE-ACETAMINOPHEN 5-325 MG PO TABS
ORAL_TABLET | ORAL | Status: AC
  Filled 2015-02-22: qty 2

## 2015-02-22 MED ORDER — HEPARIN (PORCINE) IN NACL 2-0.9 UNIT/ML-% IJ SOLN
INTRAMUSCULAR | Status: AC
  Filled 2015-02-22: qty 1000

## 2015-02-22 MED ORDER — SODIUM CHLORIDE 0.9 % IV SOLN: 1.0000 mL/kg/h | INTRAVENOUS | Status: DC

## 2015-02-22 MED ORDER — FENTANYL CITRATE (PF) 100 MCG/2ML IJ SOLN
INTRAMUSCULAR | Status: DC | PRN
  Administered 2015-02-22: 50 ug/mL
  Administered 2015-02-22: 50 ug via INTRAVENOUS

## 2015-02-22 MED ORDER — FENTANYL CITRATE (PF) 100 MCG/2ML IJ SOLN
INTRAMUSCULAR | Status: AC
  Filled 2015-02-22: qty 2

## 2015-02-22 MED ORDER — MIDAZOLAM HCL 2 MG/2ML IJ SOLN
INTRAMUSCULAR | Status: AC
  Filled 2015-02-22: qty 2

## 2015-02-22 MED ORDER — LIDOCAINE HCL (PF) 1 % IJ SOLN
INTRAMUSCULAR | Status: AC
  Filled 2015-02-22: qty 30

## 2015-02-22 MED ORDER — IODIXANOL 320 MG/ML IV SOLN
INTRAVENOUS | Status: DC | PRN
  Administered 2015-02-22: 95 mL via INTRAVENOUS

## 2015-02-22 MED ORDER — MIDAZOLAM HCL 2 MG/2ML IJ SOLN
INTRAMUSCULAR | Status: DC | PRN
  Administered 2015-02-22 (×2): 1 mg via INTRAVENOUS

## 2015-02-22 MED ORDER — OXYCODONE-ACETAMINOPHEN 5-325 MG PO TABS
1.0000 | ORAL_TABLET | ORAL | Status: DC | PRN
  Administered 2015-02-22: 2 via ORAL

## 2015-02-22 MED ORDER — SODIUM CHLORIDE 0.9 % IV SOLN
INTRAVENOUS | Status: DC
  Administered 2015-02-22: 06:00:00 via INTRAVENOUS

## 2015-02-22 MED FILL — Fentanyl Citrate Preservative Free (PF) Inj 100 MCG/2ML: INTRAMUSCULAR | Qty: 1 | Status: AC

## 2015-02-22 SURGICAL SUPPLY — 17 items
CATH ANGIO 5F BER2 100CM (CATHETERS) ×1 IMPLANT
CATH HEADHUNTER H1 5F 100CM (CATHETERS) ×1 IMPLANT
CATH SLIP GLIDE STRAIGHT 5F (CATHETERS) ×1 IMPLANT
COVER PRB 48X5XTLSCP FOLD TPE (BAG) IMPLANT
COVER PROBE 5X48 (BAG) ×4
HAND CONTROLLER AVANTA (MISCELLANEOUS) IMPLANT
KIT MICROINTRODUCER STIFF 5F (SHEATH) ×1 IMPLANT
KIT PV (KITS) ×4 IMPLANT
SET AVANTA MULTI PATIENT (MISCELLANEOUS) IMPLANT
SET AVANTA SINGLE PATIENT (MISCELLANEOUS) IMPLANT
SHEATH AVANTA HAND CONTROLLER (MISCELLANEOUS) IMPLANT
SHEATH PINNACLE 5F 10CM (SHEATH) ×1 IMPLANT
SYR MEDRAD MARK V 150ML (SYRINGE) ×1 IMPLANT
TRANSDUCER W/STOPCOCK (MISCELLANEOUS) ×4 IMPLANT
TRAY PV CATH (CUSTOM PROCEDURE TRAY) ×4 IMPLANT
WIRE HITORQ VERSACORE ST 145CM (WIRE) ×1 IMPLANT
WIRE VERSACORE LOC 115CM (WIRE) ×1 IMPLANT

## 2015-02-22 NOTE — Op Note (Signed)
   PATIENT: Gilbert Pierce   MRN: [id number] DOB: October 28, 1970    DATE OF PROCEDURE: 02/22/2015  INDICATIONS: FREEMON BINFORD is a 44 y.o. right handed male who developed ischemic changes to his left middle finger and small finger. He states that he has had issues in the past with this happening but that it always resolved. Of note he was hospitalized in 2010 with [facility] spotted fever. At that time he was diagnosed with Raynaud's syndrome of the left upper extremity. He denies any history of frostbite in the past, trauma, or cold environmental exposure. He has not used vibrating tools. He is a Orthoptist.   PROCEDURE:  1. Ultrasound-guided access to the right common femoral artery 2. Arch aortogram 3. Selective catheterization of the left brachial artery with left upper extremity arteriogram  SURGEON: Judeth Cornfield. Scot Dock, MD, FACS  ANESTHESIA: local with sedation   EBL: minimal  TECHNIQUE: The patient was taken to the peripheral vascular lab and her see 1 mg of Versed and 50 g of fentanyl. The right groin was prepped and draped in usual sterile fashion. Under ultrasound guidance, after the skin was anesthetized, the right common femoral artery was cannulated and a micropuncture needle introduced over the wire. The micro-puncture sheath was exchanged for a 5 French sheath over a first core wire. A long pigtail catheter was positioned in the ascending aortic arch and arch aortogram obtained at a 40 LAO projection. I did exchange the pigtail catheter for a Berenstein 2 catheter which was positioned into the left subclavian artery. The wire was advanced into the subclavian artery and the Berenstein catheter exchanged for a straight catheter. Selective left brachial arteriogram was obtained. In order to get better visualization distally I advanced the catheter well down into the brachial artery just above the antecubital level. This was done over the wire. Magnified views were  obtained of the left hand.  At the completion of the procedure, the catheter was removed. The patient was transferred to the holding area for removal of the sheath. No immediate competitions were noted.  FINDINGS:  1. The aortic arch, innominate artery, bilateral common carotid arteries, bilateral external carotid arteries, bilateral internal carotid arteries, bilateral vertebral arteries, and bilateral subclavian arteries are all widely patent without evidence of atherosclerotic disease. 2. The left axillary, brachial, radial, and ulnar arteries are patent. 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 The ulnar artery is occluded just above the wrist. He has diffuse disease of the digital vessels in the 2 dominant digital vessels are the lateral digital vessel to the index finger and the lateral digital vessel to the small finger. Both the medial and lateral digital vessels to the middle finger are occluded which is the finger with the most significant problems.  CLINICAL NOTE: The patient will  follow up with hand surgery for further recommendations.  Deitra Mayo, MD, FACS [facility]  DATE OF DICTATION:   02/22/2015

## 2015-02-22 NOTE — Interval H&P Note (Signed)
History and Physical Interval Note:  02/22/2015 7:26 AM  Gilbert Pierce  has presented today for surgery, with the diagnosis of left upper extremity discoloration right nodes  The various methods of treatment have been discussed with the patient and family. After consideration of risks, benefits and other options for treatment, the patient has consented to  Procedure(s): Upper Extremity Angiography (N/A) as a surgical intervention .  The patient's history has been reviewed, patient examined, no change in status, stable for surgery.  I have reviewed the patient's chart and labs.  Questions were answered to the patient's satisfaction.     Deitra Mayo

## 2015-02-22 NOTE — Progress Notes (Signed)
Site area: right groin a 5 french arterial sheath was removed  Site Prior to Removal:  Level 0  Pressure Applied For 20 MINUTES    Minutes Beginning at 0900a  Manual:   Yes.    Patient Status During Pull:  stable  Post Pull Groin Site:  Level 0  Post Pull Instructions Given:  Yes.    Post Pull Pulses Present:  Yes.    Dressing Applied:  Yes.    Comments:  VS remain stable during sheath pull.  Pt denies any discomfort at site at this time.

## 2015-02-22 NOTE — H&P (View-Only) (Signed)
[facility] Specialist [facility] [place]  Patient name: Gilbert Pierce MRN: [id number] DOB: [birth year] Sex: male  REASON FOR CONSULT: Ischemic fingers left hand  HPI: Gilbert Pierce is a 44 y.o. male who is right-handed. He is developed ischemic changes to his left middle index and small finger. He states that this is happened before and that these changes usually resolve. 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 At that time, a rheumatologist diagnosed him with Raynaud's involving the left upper extremity. He states that he occasionally develops some bluish discoloration of his fingertips which is made worse by cold temperature. His most recent symptoms began about 6 weeks ago. He's had some persistent discoloration of the distal aspect of his right middle finger and also some petechial hemorrhages under the left index finger. His symptoms actually started in the left small finger.   He is right-handed. There is no history of frostbite in the past. 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 He has never lived in cold environment except for when he was younger in [place] for a few years.   Past Medical History  Diagnosis Date  . Crohn disease   . Raynaud's disease   . Cancer   . Hypertension    Family History  Problem Relation Age of Onset  . Cancer Mother   . Heart disease Mother   . Hyperlipidemia Mother   . Hypertension Mother   . Heart disease Father   . Hyperlipidemia Father   . Hypertension Father    SOCIAL HISTORY: History  Substance Use Topics  . Smoking status: Former Smoker    Start date: 02/16/2013  . Smokeless tobacco: Not on file  . Alcohol Use: 1.8 - 3.6 oz/week    1-2 Glasses of wine, 1-2 Cans of beer, 1-2 Shots of liquor per week   No Known Allergies Current Outpatient Prescriptions  Medication Sig Dispense Refill  . Adalimumab 40 MG/0.8ML PSKT  Inject 40 mg into the skin every 14 (fourteen) days.    Marland Kitchen amLODipine (NORVASC) 5 MG tablet Take 5 mg by mouth daily.    Marland Kitchen aspirin EC 81 MG tablet Take 81 mg by mouth daily.    . cetirizine (ZYRTEC) 10 MG tablet Take 10 mg by mouth daily as needed for allergies.     . Cinnamon 500 MG TABS Take 1 tablet by mouth daily.    . famciclovir (FAMVIR) 250 MG tablet Take 4 tablets (1,000 mg total) by mouth 2 (two) times daily. Please take 1000 mg twice a day for 1 day 30 tablet 0  . fenofibrate (TRICOR) 145 MG tablet Take 145 mg by mouth daily.    . fish oil-omega-3 fatty acids 1000 MG capsule Take 2 g by mouth daily.    . Ginkgo Biloba Extract 60 MG CAPS Take 1 capsule by mouth daily.    . lansoprazole (PREVACID) 15 MG capsule Take 15 mg by mouth daily.    Marland Kitchen lisinopril (PRINIVIL,ZESTRIL) 20 MG tablet Take 20 mg by mouth daily.    Marland Kitchen omeprazole (PRILOSEC) 40 MG capsule Take 40 mg by mouth daily.    Marland Kitchen oxyCODONE-acetaminophen (PERCOCET/ROXICET) 5-325 MG per tablet Take 1-2 tablets by mouth every 4 (four) hours as needed for severe pain. 20 tablet 0  . tadalafil (CIALIS) 10 MG tablet Take 10 mg by mouth daily as needed. E.D.     No current facility-administered  medications for this visit.   REVIEW OF SYSTEMS: Valu.Nieves ] denotes positive finding; [  ] denotes negative finding  CARDIOVASCULAR:  [ ]  chest pain   [ ]  chest pressure   [ ]  palpitations   [ ]  orthopnea   [ ]  dyspnea on exertion   [ ]  claudication   [ ]  rest pain   [ ]  DVT   [ ]  phlebitis PULMONARY:   [ ]  productive cough   [ ]  asthma   [ ]  wheezing NEUROLOGIC:   [ ]  weakness  [ ]  paresthesias  [ ]  aphasia  [ ]  amaurosis  [ ]  dizziness HEMATOLOGIC:   [ ]  bleeding problems   [ ]  clotting disorders MUSCULOSKELETAL:  [ ]  joint pain   [ ]  joint swelling [ ]  leg swelling GASTROINTESTINAL: [ ]   blood in stool  [ ]   hematemesis GENITOURINARY:  [ ]   dysuria  [ ]   hematuria PSYCHIATRIC:  [ ]  history of major depression INTEGUMENTARY:  [ ]  rashes  [ ]   ulcers CONSTITUTIONAL:  [ ]  fever   [ ]  chills  PHYSICAL EXAM: Filed Vitals:   02/17/15 1522  BP: 129/79  Pulse: 83  Height: 5\' 11"  (1.803 m)  Weight: 225 lb (102.059 kg)  SpO2: 99%   GENERAL: The patient is a well-nourished male, in no acute distress. The vital signs are documented above. CARDIOVASCULAR: There is a regular rate and rhythm. I do not detect carotid bruits. He has palpable brachial, radial, and ulnar pulses bilaterally. He has a positive Allen test bilaterally PULMONARY: There is good air exchange bilaterally without wheezing or rales. ABDOMEN: Soft and non-tender with normal pitched bowel sounds.  MUSCULOSKELETAL: There are no major deformities or cyanosis. NEUROLOGIC: No focal weakness or paresthesias are detected. SKIN: He has some purplish discoloration of the distal aspect of his left middle finger. There are some petechial hemorrhages under the left index finger nailbed. The left small finger where he initially had symptoms appears adequately perfused. PSYCHIATRIC: The patient has a normal affect.  DATA:  I reviewed his CT angiogram which shows no significant arterial occlusive disease to the level of the palmar arch. The digital arteries could not be adequately assessed.  MEDICAL ISSUES:  HISTORY OF RAYNAUD'S SYNDROME: The patient has ischemic changes of the middle and index finger of the left hand. There is no evidence of large vessel disease with palpable radial and ulnar pulses bilaterally. CT angiogram showed no significant large vessel disease. The Palmar Arch could not be visualized and I have recommended we proceed with an arteriogram to further evaluate the palmar arch. In addition arrangements are being made for him to see Dr. Amedeo Plenty at [facility]. He has not seen a rheumatologist since 2010 when he was originally diagnosed with Raynaud's syndrome. His arteriogram is scheduled for 02/22/2015. We have discussed the indications for the procedure and  the potential complications including but not limited to leading, arterial injury, or diarrhea. All his questions were answered and he is agreeable to proceed.  Deitra Mayo [facility] [address]: [phone]

## 2015-02-22 NOTE — Discharge Instructions (Signed)

## 2015-02-23 MED FILL — Heparin Sodium (Porcine) 2 Unit/ML in Sodium Chloride 0.9%: INTRAMUSCULAR | Qty: 1000 | Status: AC

## 2015-02-23 MED FILL — Lidocaine HCl Local Preservative Free (PF) Inj 1%: INTRAMUSCULAR | Qty: 30 | Status: AC

## 2016-10-02 HISTORY — PX: COLONOSCOPY: SHX174

## 2017-06-08 ENCOUNTER — Encounter (HOSPITAL_COMMUNITY): Payer: Self-pay

## 2017-06-08 ENCOUNTER — Emergency Department (HOSPITAL_COMMUNITY): Payer: Managed Care, Other (non HMO)

## 2017-06-08 ENCOUNTER — Emergency Department (HOSPITAL_COMMUNITY)
Admission: EM | Admit: 2017-06-08 | Discharge: 2017-06-08 | Disposition: A | Discharge status: Home / Self Care | Payer: Managed Care, Other (non HMO) | Attending: Emergency Medicine | Admitting: Emergency Medicine

## 2017-06-08 DIAGNOSIS — R0789 Other chest pain: Secondary | ICD-10-CM | POA: Diagnosis not present

## 2017-06-08 DIAGNOSIS — Z8249 Family history of ischemic heart disease and other diseases of the circulatory system: Secondary | ICD-10-CM | POA: Insufficient documentation

## 2017-06-08 DIAGNOSIS — Z859 Personal history of malignant neoplasm, unspecified: Secondary | ICD-10-CM | POA: Insufficient documentation

## 2017-06-08 DIAGNOSIS — R079 Chest pain, unspecified: Secondary | ICD-10-CM | POA: Diagnosis present

## 2017-06-08 DIAGNOSIS — I10 Essential (primary) hypertension: Secondary | ICD-10-CM | POA: Diagnosis not present

## 2017-06-08 DIAGNOSIS — Z79899 Other long term (current) drug therapy: Secondary | ICD-10-CM | POA: Diagnosis not present

## 2017-06-08 DIAGNOSIS — Z87891 Personal history of nicotine dependence: Secondary | ICD-10-CM | POA: Insufficient documentation

## 2017-06-08 LAB — CBC
HCT: 44.8 % (ref 39.0–52.0)
Hemoglobin: 15.9 g/dL (ref 13.0–17.0)
MCH: 30.8 pg (ref 26.0–34.0)
MCHC: 35.5 g/dL (ref 30.0–36.0)
MCV: 86.7 fL (ref 78.0–100.0)
Platelets: 255 10*3/uL (ref 150–400)
RBC: 5.17 MIL/uL (ref 4.22–5.81)
RDW: 12.4 % (ref 11.5–15.5)
WBC: 11 10*3/uL — ABNORMAL HIGH (ref 4.0–10.5)

## 2017-06-08 LAB — BASIC METABOLIC PANEL
Anion gap: 8 (ref 5–15)
BUN: 10 mg/dL (ref 6–20)
CO2: 25 mmol/L (ref 22–32)
Calcium: 8.8 mg/dL — ABNORMAL LOW (ref 8.9–10.3)
Chloride: 105 mmol/L (ref 101–111)
Creatinine, Ser: 1.01 mg/dL (ref 0.61–1.24)
GFR calc Af Amer: >60 mL/min (ref >60)
GFR calc non Af Amer: >60 mL/min (ref >60)
Glucose, Bld: 94 mg/dL (ref 65–99)
Potassium: 4.2 mmol/L (ref 3.5–5.1)
Sodium: 138 mmol/L (ref 135–145)

## 2017-06-08 LAB — I-STAT TROPONIN, ED: Troponin i, poc: 0.01 ng/mL (ref 0.00–0.08)

## 2017-06-08 LAB — D-DIMER, QUANTITATIVE: D-Dimer, Quant: <0.27 ug/mL-FEU (ref 0.00–0.50)

## 2017-06-08 MED ORDER — IBUPROFEN 800 MG PO TABS: 800.0000 mg | ORAL_TABLET | Freq: Three times a day (TID) | ORAL | 0 refills | Status: DC | PRN

## 2017-06-08 MED ORDER — KETOROLAC TROMETHAMINE 15 MG/ML IJ SOLN
15.0000 mg | Freq: Once | INTRAMUSCULAR | Status: AC
  Administered 2017-06-08: 15 mg via INTRAVENOUS
  Filled 2017-06-08: qty 1

## 2017-06-08 MED ORDER — FAMOTIDINE 20 MG PO TABS: 20.0000 mg | ORAL_TABLET | Freq: Two times a day (BID) | ORAL | 0 refills | Status: DC

## 2017-06-08 NOTE — ED Triage Notes (Signed)
Patient c/o a constant CP that radiates into the back  x 6 days. Patient states the pain worsens when he stands up. Patient states he has slight SOB, slight dizziness and slight nausea.

## 2017-06-08 NOTE — ED Provider Notes (Signed)
[facility] DEPT Provider Note   CSN: [id number] Arrival date & time: 06/08/17  1111     History   Chief Complaint Chief Complaint  Patient presents with  . Chest Pain    HPI Gilbert Pierce is a 46 y.o. male.  The history is provided by the patient. No language interpreter was used.  Chest Pain      Gilbert Pierce is a 46 y.o. male who presents to the Emergency Department complaining of Chest pain. Reports 6 days of constant, stabbing, left-sided chest pain. Pain is also in his back.  Pain is worse when he goes to stand up. He denies any fevers, known injuries, shortness of breath, cough, abdominal pain, vomiting, leg swelling or pain. No prior similar symptoms. He does have a history of Crohn's that is thought to be well-controlled with his Humira.  He also has a history of hypertension. He has no history of high cholesterol.  He is a nonsmoker. Has a family history of cardiac disease in his father in his 67s.  Past Medical History:  Diagnosis Date  . Cancer ([place])   . Crohn disease ([place])   . Hypertension   . Raynaud's disease     Patient Active Problem List   Diagnosis Date Noted  . Raynaud's syndrome 02/22/2015    Past Surgical History:  Procedure Laterality Date  . ARCH AORTOGRAM  02/22/2015   Procedure: Clydie Braun;  Surgeon: Angelia Mould, MD;  Location: [facility];  Service: Cardiovascular;;  . EXPLORATORY LAPAROTOMY    . PERIPHERAL VASCULAR CATHETERIZATION Left 02/22/2015   Procedure: Upper Extremity Angiography;  Surgeon: Angelia Mould, MD;  Location: [facility];  Service: Cardiovascular;  Laterality: Left;  . SHOULDER ARTHROSCOPY W/ SUPERIOR LABRAL ANTERIOR POSTERIOR REPAIR    . ULTRASOUND GUIDANCE FOR VASCULAR ACCESS Right 02/22/2015   Procedure: Ultrasound Guidance For Vascular Access;  Surgeon: Angelia Mould, MD;  Location: [facility];  Service: Cardiovascular;  Laterality: Right;       Home  Medications    Prior to Admission medications   Medication Sig Start Date End Date Taking? Authorizing Provider  Adalimumab 40 MG/0.8ML PSKT Inject 40 mg into the skin every 14 (fourteen) days.   Yes [provider]  amLODipine (NORVASC) 5 MG tablet Take 5 mg by mouth daily. 11/18/14  Yes [provider]  celecoxib (CELEBREX) 200 MG capsule Take 200 mg by mouth daily as needed for mild pain.   Yes [provider]  cyanocobalamin (,VITAMIN B-12,) 1000 MCG/ML injection Inject 1,000 mcg into the skin every 30 (thirty) days. 03/24/17  Yes [provider]  lisinopril (PRINIVIL,ZESTRIL) 20 MG tablet Take 20 mg by mouth daily. 12/23/14  Yes [provider]  Multiple Vitamin (MULTIVITAMIN WITH MINERALS) TABS tablet Take 1 tablet by mouth daily.   Yes [provider]  omeprazole (PRILOSEC) 40 MG capsule Take 40 mg by mouth daily. 11/16/14  Yes [provider]  Probiotic Product (PROBIOTIC DAILY PO) Take 1 capsule by mouth daily.   Yes [provider]  tadalafil (CIALIS) 10 MG tablet Take 5 mg by mouth daily as needed. E.D.    Yes [provider]  testosterone cypionate (DEPOTESTOSTERONE CYPIONATE) 200 MG/ML injection Inject 200 mg into the muscle every 14 (fourteen) days. 05/07/17  Yes [provider]  famotidine (PEPCID) 20 MG tablet Take 1 tablet (20 mg total) by mouth 2 (two) times daily. 06/08/17   Quintella Reichert, MD  ibuprofen (ADVIL,MOTRIN) 800 MG tablet Take 1 tablet (800 mg total) by mouth every 8 (eight) hours as needed. 06/08/17   Quintella Reichert, MD    Family History Family History  Problem Relation Age of Onset  . Cancer Mother   . Heart disease Mother   . Hyperlipidemia Mother   . Hypertension Mother   . Heart disease Father   . Hyperlipidemia Father   . Hypertension Father     Social History Social History  Substance Use Topics  . Smoking status: Former Smoker    Start date: 02/16/2013  . Smokeless  tobacco: Never Used  . Alcohol use 1.8 - 3.6 oz/week    1 - 2 Glasses of wine, 1 - 2 Cans of beer, 1 - 2 Shots of liquor per week     Allergies   Patient has no known allergies.   Review of Systems Review of Systems  Cardiovascular: Positive for chest pain.  All other systems reviewed and are negative.    Physical Exam Updated Vital Signs BP (!) 149/101 (BP Location: Right Arm)   Pulse 85   Temp 98 F (36.7 C) (Oral)   Resp 16   Ht 5\' 11"  (1.803 m)   Wt 103 kg (227 lb)   SpO2 98%   BMI 31.66 kg/m   Physical Exam  Constitutional: He is oriented to person, place, and time. He appears well-developed and well-nourished.  HENT:  Head: Normocephalic and atraumatic.  Cardiovascular: Normal rate and regular rhythm.   No murmur heard. Pulmonary/Chest: Effort normal and breath sounds normal. No respiratory distress. He exhibits tenderness.  Abdominal: Soft. There is no tenderness. There is no rebound and no guarding.  Musculoskeletal: He exhibits no edema or tenderness.  Neurological: He is alert and oriented to person, place, and time.  Skin: Skin is warm and dry.  Psychiatric: He has a normal mood and affect. His behavior is normal.  Nursing note and vitals reviewed.    ED Treatments / Results  Labs (all labs ordered are listed, but only abnormal results are displayed) Labs Reviewed  BASIC METABOLIC PANEL - Abnormal; Notable for the following:       Result Value   Calcium 8.8 (*)    All other components within normal limits  CBC - Abnormal; Notable for the following:    WBC 11.0 (*)    All other components within normal limits  D-DIMER, QUANTITATIVE (NOT AT [facility])  I-STAT TROPONIN, ED    EKG  EKG Interpretation None       Radiology Dg Chest 2 View  Result Date: 06/08/2017 CLINICAL DATA:  Lt mid chest pain, sharp at times with some sob for approx 6 days, previous smoker, no other chest complaints EXAM: CHEST  2 VIEW COMPARISON:  Chest x-ray dated  09/16/2014. FINDINGS: The heart size and mediastinal contours are within normal limits. Both lungs are clear. The visualized skeletal structures are unremarkable. IMPRESSION: No active cardiopulmonary disease. Electronically Signed   By: Franki Cabot M.D.   On: 06/08/2017 11:57    Procedures Procedures (including critical care time)  Medications Ordered in ED Medications  ketorolac (TORADOL) 15 MG/ML injection 15 mg (15 mg Intravenous Given 06/08/17 1343)     Initial Impression / Assessment and Plan / ED Course  I have reviewed the triage vital signs and the nursing notes.  Pertinent labs & imaging results that were available during my care of the patient were reviewed by me and considered in my medical decision making (  see chart for details).   patient here for evaluation of chest pain that has been constant for the last 6 days. EKG with no acute ischemic changes. He does have some chest wall tenderness on examination and increased pain with sitting up in bed. His symptoms are slightly improved after oral. Presentation is not consistent with ACS, PE, dissection. Discussed home care, outpatient follow-up. Impressions.  Final Clinical Impressions(s) / ED Diagnoses   Final diagnoses:  Chest wall pain    New Prescriptions New Prescriptions   FAMOTIDINE (PEPCID) 20 MG TABLET    Take 1 tablet (20 mg total) by mouth 2 (two) times daily.   IBUPROFEN (ADVIL,MOTRIN) 800 MG TABLET    Take 1 tablet (800 mg total) by mouth every 8 (eight) hours as needed.     Quintella Reichert, MD 06/08/17 [phone]

## 2017-07-24 ENCOUNTER — Encounter: Payer: Self-pay | Admitting: Interventional Cardiology

## 2017-08-09 ENCOUNTER — Ambulatory Visit: Payer: Managed Care, Other (non HMO) | Admitting: Interventional Cardiology

## 2017-08-13 ENCOUNTER — Encounter: Payer: Self-pay | Admitting: Interventional Cardiology

## 2018-01-16 ENCOUNTER — Other Ambulatory Visit: Payer: Self-pay | Admitting: Internal Medicine

## 2018-01-16 ENCOUNTER — Ambulatory Visit
Admission: RE | Admit: 2018-01-16 | Discharge: 2018-01-16 | Disposition: A | Discharge status: Home / Self Care | Payer: BLUE CROSS/BLUE SHIELD | Source: Ambulatory Visit | Attending: Internal Medicine | Admitting: Internal Medicine

## 2018-01-16 DIAGNOSIS — M25552 Pain in left hip: Secondary | ICD-10-CM

## 2018-12-04 ENCOUNTER — Encounter (HOSPITAL_COMMUNITY): Payer: Self-pay | Admitting: Emergency Medicine

## 2018-12-04 ENCOUNTER — Emergency Department (HOSPITAL_COMMUNITY): Payer: BLUE CROSS/BLUE SHIELD

## 2018-12-04 ENCOUNTER — Emergency Department (HOSPITAL_COMMUNITY)
Admission: EM | Admit: 2018-12-04 | Discharge: 2018-12-04 | Disposition: A | Discharge status: Home / Self Care | Payer: BLUE CROSS/BLUE SHIELD | Attending: Emergency Medicine | Admitting: Emergency Medicine

## 2018-12-04 ENCOUNTER — Other Ambulatory Visit: Payer: Self-pay

## 2018-12-04 DIAGNOSIS — I10 Essential (primary) hypertension: Secondary | ICD-10-CM | POA: Diagnosis not present

## 2018-12-04 DIAGNOSIS — R1013 Epigastric pain: Secondary | ICD-10-CM

## 2018-12-04 DIAGNOSIS — Z79899 Other long term (current) drug therapy: Secondary | ICD-10-CM | POA: Diagnosis not present

## 2018-12-04 DIAGNOSIS — Z87891 Personal history of nicotine dependence: Secondary | ICD-10-CM | POA: Insufficient documentation

## 2018-12-04 DIAGNOSIS — Z859 Personal history of malignant neoplasm, unspecified: Secondary | ICD-10-CM | POA: Diagnosis not present

## 2018-12-04 LAB — CBC
HCT: 54.6 % — ABNORMAL HIGH (ref 39.0–52.0)
Hemoglobin: 17.3 g/dL — ABNORMAL HIGH (ref 13.0–17.0)
MCH: 28.8 pg (ref 26.0–34.0)
MCHC: 31.7 g/dL (ref 30.0–36.0)
MCV: 91 fL (ref 80.0–100.0)
Platelets: 268 10*3/uL (ref 150–400)
RBC: 6 MIL/uL — ABNORMAL HIGH (ref 4.22–5.81)
RDW: 12.9 % (ref 11.5–15.5)
WBC: 10.2 10*3/uL (ref 4.0–10.5)
nRBC: 0 % (ref 0.0–0.2)

## 2018-12-04 LAB — URINALYSIS, ROUTINE W REFLEX MICROSCOPIC
Bilirubin Urine: NEGATIVE
Glucose, UA: NEGATIVE mg/dL
Hgb urine dipstick: NEGATIVE
Ketones, ur: NEGATIVE mg/dL
Leukocytes,Ua: NEGATIVE
Nitrite: NEGATIVE
Protein, ur: NEGATIVE mg/dL
Specific Gravity, Urine: 1.02 (ref 1.005–1.030)
pH: 5 (ref 5.0–8.0)

## 2018-12-04 LAB — COMPREHENSIVE METABOLIC PANEL
ALT: 42 U/L (ref 0–44)
AST: 28 U/L (ref 15–41)
Albumin: 4.1 g/dL (ref 3.5–5.0)
Alkaline Phosphatase: 43 U/L (ref 38–126)
Anion gap: 10 (ref 5–15)
BUN: 12 mg/dL (ref 6–20)
CO2: 23 mmol/L (ref 22–32)
Calcium: 8.9 mg/dL (ref 8.9–10.3)
Chloride: 104 mmol/L (ref 98–111)
Creatinine, Ser: 1.13 mg/dL (ref 0.61–1.24)
GFR calc Af Amer: >60 mL/min (ref >60)
GFR calc non Af Amer: >60 mL/min (ref >60)
Glucose, Bld: 93 mg/dL (ref 70–99)
Potassium: 4.1 mmol/L (ref 3.5–5.1)
Sodium: 137 mmol/L (ref 135–145)
Total Bilirubin: 0.6 mg/dL (ref 0.3–1.2)
Total Protein: 7.7 g/dL (ref 6.5–8.1)

## 2018-12-04 LAB — LIPASE, BLOOD: Lipase: 38 U/L (ref 11–51)

## 2018-12-04 MED ORDER — ALUM & MAG HYDROXIDE-SIMETH 200-200-20 MG/5ML PO SUSP
30.0000 mL | Freq: Once | ORAL | Status: AC
  Administered 2018-12-04: 30 mL via ORAL
  Filled 2018-12-04: qty 30

## 2018-12-04 MED ORDER — FAMOTIDINE 20 MG PO TABS: 20.0000 mg | ORAL_TABLET | Freq: Two times a day (BID) | ORAL | 0 refills | Status: DC

## 2018-12-04 MED ORDER — IOPAMIDOL (ISOVUE-300) INJECTION 61%
INTRAVENOUS | Status: AC
  Filled 2018-12-04: qty 100

## 2018-12-04 MED ORDER — MORPHINE SULFATE (PF) 4 MG/ML IV SOLN
4.0000 mg | Freq: Once | INTRAVENOUS | Status: AC
  Administered 2018-12-04: 4 mg via INTRAVENOUS
  Filled 2018-12-04: qty 1

## 2018-12-04 MED ORDER — SUCRALFATE 1 G PO TABS
1.0000 g | ORAL_TABLET | Freq: Once | ORAL | Status: AC
  Administered 2018-12-04: 1 g via ORAL
  Filled 2018-12-04: qty 1

## 2018-12-04 MED ORDER — SODIUM CHLORIDE 0.9% FLUSH: 3.0000 mL | Freq: Once | INTRAVENOUS | Status: DC

## 2018-12-04 MED ORDER — IOPAMIDOL (ISOVUE-300) INJECTION 61%
100.0000 mL | Freq: Once | INTRAVENOUS | Status: AC | PRN
  Administered 2018-12-04: 100 mL via INTRAVENOUS

## 2018-12-04 MED ORDER — FENTANYL CITRATE (PF) 100 MCG/2ML IJ SOLN
50.0000 ug | Freq: Once | INTRAMUSCULAR | Status: DC
  Filled 2018-12-04: qty 2

## 2018-12-04 MED ORDER — SODIUM CHLORIDE (PF) 0.9 % IJ SOLN
INTRAMUSCULAR | Status: AC
  Filled 2018-12-04: qty 50

## 2018-12-04 MED ORDER — IOHEXOL 300 MG/ML  SOLN
30.0000 mL | Freq: Once | INTRAMUSCULAR | Status: AC | PRN
  Administered 2018-12-04: 30 mL via ORAL

## 2018-12-04 MED ORDER — IOHEXOL 300 MG/ML  SOLN: 100.0000 mL | Freq: Once | INTRAMUSCULAR | Status: DC | PRN

## 2018-12-04 MED ORDER — FAMOTIDINE 20 MG PO TABS
20.0000 mg | ORAL_TABLET | Freq: Once | ORAL | Status: AC
  Administered 2018-12-04: 20 mg via ORAL
  Filled 2018-12-04: qty 1

## 2018-12-04 MED ORDER — ONDANSETRON HCL 4 MG/2ML IJ SOLN
4.0000 mg | Freq: Once | INTRAMUSCULAR | Status: AC
  Administered 2018-12-04: 4 mg via INTRAVENOUS
  Filled 2018-12-04: qty 2

## 2018-12-04 MED ORDER — SUCRALFATE 1 GM/10ML PO SUSP: 1.0000 g | Freq: Three times a day (TID) | ORAL | 0 refills | Status: DC

## 2018-12-04 MED ORDER — LIDOCAINE VISCOUS HCL 2 % MT SOLN
15.0000 mL | Freq: Once | OROMUCOSAL | Status: AC
  Administered 2018-12-04: 15 mL via ORAL
  Filled 2018-12-04: qty 15

## 2018-12-04 MED ORDER — SODIUM CHLORIDE 0.9 % IV BOLUS
1000.0000 mL | Freq: Once | INTRAVENOUS | Status: AC
  Administered 2018-12-04: 1000 mL via INTRAVENOUS

## 2018-12-04 NOTE — ED Notes (Signed)
Pt given urinal and made aware of need for urine specimen 

## 2018-12-04 NOTE — ED Provider Notes (Signed)
[facility] DEPT Provider Note   CSN: [id number] Arrival date & time: 12/04/18  1243    History   Chief Complaint Chief Complaint  Patient presents with  . Abdominal Pain  . Nausea    HPI Gilbert Pierce is a 48 y.o. male with PMH/o Crohn's, GERD, HTN, Dyslipidemia who presents for evaluation of 3 days of epigastric abdominal pain that has been constant. He states that he has associated nausea but no vomiting. He does report decreased appetite since onset of pain.  He reports he took Pepto-Bismol and omeprazole with no improvement in pain.  Patient states that he has had a few loose stools but no evidence of bright red blood in stools.  Patient states that he is also taken Percocet but states that the medications are not helping.  Patient states that he has not had any fevers, urinary symptoms, chest pain, difficulty breathing.  He does have a history of Crohn's and currently sees GI doctor at [facility].  His last colonoscopy was in 2018.  His last endoscopy was 15 years ago.    The history is provided by the patient.    Past Medical History:  Diagnosis Date  . Cancer ([place])   . Crohn disease ([place])   . Dyslipidemia   . GERD (gastroesophageal reflux disease)   . Hypertension   . Raynaud's disease     Patient Active Problem List   Diagnosis Date Noted  . Raynaud's syndrome 02/22/2015    Past Surgical History:  Procedure Laterality Date  . ARCH AORTOGRAM  02/22/2015   Procedure: Clydie Braun;  Surgeon: Angelia Mould, MD;  Location: [facility];  Service: Cardiovascular;;  . EXPLORATORY LAPAROTOMY    . PERIPHERAL VASCULAR CATHETERIZATION Left 02/22/2015   Procedure: Upper Extremity Angiography;  Surgeon: Angelia Mould, MD;  Location: [facility];  Service: Cardiovascular;  Laterality: Left;  . SHOULDER ARTHROSCOPY W/ SUPERIOR LABRAL ANTERIOR POSTERIOR REPAIR    . ULTRASOUND GUIDANCE FOR VASCULAR ACCESS Right 02/22/2015   Procedure: Ultrasound Guidance For Vascular Access;  Surgeon: Angelia Mould, MD;  Location: [facility];  Service: Cardiovascular;  Laterality: Right;        Home Medications    Prior to Admission medications   Medication Sig Start Date End Date Taking? Authorizing Provider  amLODipine (NORVASC) 5 MG tablet Take 5 mg by mouth daily. 11/18/14  Yes [provider]  celecoxib (CELEBREX) 200 MG capsule Take 200 mg by mouth daily as needed for mild pain.   Yes [provider]  cholestyramine (QUESTRAN) 4 g packet Take 4 g by mouth at bedtime.  12/02/18  Yes [provider]  cyanocobalamin (,VITAMIN B-12,) 1000 MCG/ML injection Inject 1,000 mcg into the skin every 21 ( twenty-one) days.  03/24/17  Yes [provider]  Ginkgo Biloba 40 MG TABS Take 1 tablet by mouth daily.   Yes [provider]  lisinopril (PRINIVIL,ZESTRIL) 20 MG tablet Take 20 mg by mouth daily. 12/23/14  Yes [provider]  magnesium 30 MG tablet Take 30 mg by mouth daily.   Yes [provider]  Multiple Vitamin (MULTIVITAMIN WITH MINERALS) TABS tablet Take 1 tablet by mouth daily.   Yes [provider]  omeprazole (PRILOSEC) 40 MG capsule Take 40 mg by mouth daily. 11/16/14  Yes [provider]  tadalafil (CIALIS) 10 MG tablet Take 5 mg by mouth daily. E.D.    Yes [provider]  Adalimumab  40 MG/0.8ML PSKT Inject 40 mg into the skin every 14 (fourteen) days.    [provider]  famotidine (PEPCID) 20 MG tablet Take 1 tablet (20 mg total) by mouth 2 (two) times daily. 12/04/18   Volanda Napoleon, PA-C  ibuprofen (ADVIL,MOTRIN) 800 MG tablet Take 1 tablet (800 mg total) by mouth every 8 (eight) hours as needed. Patient not taking: Reported on 12/04/2018 06/08/17   Quintella Reichert, MD  sucralfate (CARAFATE) 1 GM/10ML suspension Take 10 mLs (1 g total) by mouth 4 (four) times daily -  with meals and at bedtime. 12/04/18   Volanda Napoleon, PA-C  testosterone cypionate (DEPOTESTOSTERONE CYPIONATE) 200 MG/ML injection Inject 200 mg into the muscle every 14 (fourteen) days. 05/07/17   [provider]    Family History Family History  Problem Relation Age of Onset  . Cancer Mother   . Heart disease Mother   . Hyperlipidemia Mother   . Hypertension Mother   . Heart disease Father   . Hyperlipidemia Father   . Hypertension Father   . Hypertension Brother     Social History Social History   Tobacco Use  . Smoking status: Former Smoker    Start date: 02/16/2013  . Smokeless tobacco: Never Used  Substance Use Topics  . Alcohol use: Yes    Alcohol/week: 3.0 - 6.0 standard drinks    Types: 1 - 2 Glasses of wine, 1 - 2 Cans of beer, 1 - 2 Shots of liquor per week  . Drug use: No     Allergies   Patient has no known allergies.   Review of Systems Review of Systems  Constitutional: Positive for appetite change. Negative for fever.  Respiratory: Negative for cough and shortness of breath.   Cardiovascular: Negative for chest pain.  Gastrointestinal: Positive for abdominal pain, diarrhea, nausea and vomiting.  Genitourinary: Negative for dysuria and hematuria.  Neurological: Negative for headaches.  All other systems reviewed and are negative.    Physical Exam Updated Vital Signs BP (!) 151/94 (BP Location: Right Arm)   Pulse 79   Temp 98.2 F (36.8 C) (Oral)   Resp 16   Ht 5\' 11"  (1.803 m)   Wt 111.1 kg   SpO2 99%   BMI 34.17 kg/m   Physical Exam Vitals signs and nursing note reviewed.  Constitutional:      Appearance: Normal appearance. He is well-developed.     Comments: Appears comfortable but NAD.   HENT:     Head: Normocephalic and atraumatic.  Eyes:     General: Lids are normal.     Conjunctiva/sclera: Conjunctivae normal.     Pupils: Pupils are equal, round, and reactive to light.  Neck:     Musculoskeletal: Full passive range of motion without pain.  Cardiovascular:      Rate and Rhythm: Normal rate and regular rhythm.     Pulses: Normal pulses.     Heart sounds: Normal heart sounds. No murmur. No friction rub. No gallop.   Pulmonary:     Effort: Pulmonary effort is normal.     Breath sounds: Normal breath sounds.     Comments: Lungs clear to auscultation bilaterally.  Symmetric chest rise.  No wheezing, rales, rhonchi. Abdominal:     Palpations: Abdomen is soft. Abdomen is not rigid.     Tenderness: There is abdominal tenderness in the epigastric area. There is no guarding.     Comments: Abdomen is soft, non-distended. Tenderness to palpation noted to  the epigastric region. No rigidity or guarding.   Musculoskeletal: Normal range of motion.  Skin:    General: Skin is warm and dry.     Capillary Refill: Capillary refill takes less than 2 seconds.  Neurological:     Mental Status: He is alert and oriented to person, place, and time.  Psychiatric:        Speech: Speech normal.     ttp to epigastric    ED Treatments / Results  Labs (all labs ordered are listed, but only abnormal results are displayed) Labs Reviewed  CBC - Abnormal; Notable for the following components:      Result Value   RBC 6.00 (*)    Hemoglobin 17.3 (*)    HCT 54.6 (*)    All other components within normal limits  LIPASE, BLOOD  COMPREHENSIVE METABOLIC PANEL  URINALYSIS, ROUTINE W REFLEX MICROSCOPIC    EKG None  Radiology Ct Abdomen Pelvis W Contrast  Result Date: 12/04/2018 CLINICAL DATA:  History of Crohn's with left upper quadrant pain since Monday. EXAM: CT ABDOMEN AND PELVIS WITH CONTRAST TECHNIQUE: Multidetector CT imaging of the abdomen and pelvis was performed using the standard protocol following bolus administration of intravenous contrast. CONTRAST:  51mL OMNIPAQUE IOHEXOL 300 MG/ML SOLN, 118mL ISOVUE-300 IOPAMIDOL (ISOVUE-300) INJECTION 61% COMPARISON:  01/28/2014 FINDINGS: Lower chest: Borderline cardiomegaly. No pericardial effusion. No acute pulmonary  consolidation. Passive atelectasis at the left lung base. Hepatobiliary: Dependent gallstones without secondary signs of acute cholecystitis. Homogeneous attenuation of the liver without space-occupying mass. No biliary dilatation. Pancreas: Normal Spleen: Normal Adrenals/Urinary Tract: Nonobstructing left lower pole renal calculus measuring up to 7 mm. No hydroureteronephrosis. No ureteral calculi. The urinary bladder is decompressed in appearance and slightly thick-walled as a result. Stomach/Bowel: The stomach is physiologically distended with ingested oral contrast. Antegrade flow of contrast is noted within small bowel without evidence of mechanical obstruction. No inflammatory process identified. Surgical partial colectomy with intact anastomotic sutures in the region of the transverse colon without obstruction. Moderate stool retention is seen within the colon. Luminal narrowing of the rectosigmoid, series 2/70 5 May reflect chronic changes of the patient's underlying inflammatory bowel disease. Spasm is also possibility accounting for this appearance. Vascular/Lymphatic: No significant vascular findings are present. No enlarged abdominal or pelvic lymph nodes. Reproductive: Normal size prostate and seminal vesicles. Other: No findings of sacroiliitis. No acute nor suspicious osseous lesions. Musculoskeletal: No acute or significant osseous findings. IMPRESSION: No evidence of active hip bowel inflammation or obstruction. Moderate stool retention within the colon. Luminal narrowing of the rectosigmoid may simply reflect colonic spasm. Stricture from remote inflammatory bowel disease is not entirely excluded but believed less likely. Uncomplicated cholelithiasis. Nonobstructing 7 mm left lower pole renal calculus. Electronically Signed   By: Ashley Royalty M.D.   On: 12/04/2018 19:02    Procedures Procedures (including critical care time)  Medications Ordered in ED Medications  sodium chloride 0.9 % bolus  1,000 mL (0 mLs Intravenous Stopped 12/04/18 2004)  ondansetron (ZOFRAN) injection 4 mg (4 mg Intravenous Given 12/04/18 1533)  morphine 4 MG/ML injection 4 mg (4 mg Intravenous Given 12/04/18 1533)  alum & mag hydroxide-simeth (MAALOX/MYLANTA) 200-200-20 MG/5ML suspension 30 mL (30 mLs Oral Given 12/04/18 1544)    And  lidocaine (XYLOCAINE) 2 % viscous mouth solution 15 mL (15 mLs Oral Given 12/04/18 1544)  iohexol (OMNIPAQUE) 300 MG/ML solution 30 mL (30 mLs Oral Contrast Given 12/04/18 1758)  iopamidol (ISOVUE-300) 61 % injection 100 mL (100 mLs  Intravenous Contrast Given 12/04/18 1800)  sucralfate (CARAFATE) tablet 1 g (1 g Oral Given 12/04/18 2004)  famotidine (PEPCID) tablet 20 mg (20 mg Oral Given 12/04/18 2004)     Initial Impression / Assessment and Plan / ED Course  I have reviewed the triage vital signs and the nursing notes.  Pertinent labs & imaging results that were available during my care of the patient were reviewed by me and considered in my medical decision making (see chart for details).        48 year old male with past medical history of Crohn's presents for evaluation of 3 days of epigastric abdominal pain, nausea and decreased appetite.  No vomiting, fevers, chest pain, difficulty breathing.  History of Crohn's and takes Humira twice monthly. Patient is afebrile, non-toxic appearing, sitting comfortably on examination table. Vital signs reviewed and stable.  Plan to check labs.  Consider gastritis versus PUD versus Crohn's flare.  CMP unremarkable.  Lipase unremarkable.  CBC without any leukocytosis or anemia.  UA negative for any acute abnormality.  CT abd/pelvis reviewed.  No evidence of bowel formation or obstruction.  There is moderate stool retention within the colon.  There is some narrowing of the rectosigmoid which may reflect colonic spasm.  There is mention of cholelithiasis but uncomplicated.  There is a nonobstructing 7 mm left renal calculus.  Discussed results with  patient.  Patient states he is still having pain but refused any more IV pain medications.  He has not not had any nausea/vomiting.  Repeat abdominal exam slightly improved since being here in the ED.  Patient states he feels comfortable going home.  I did discuss with patient that he will likely need to follow-up with GI and get an endoscopy if his pain continues.  We will plan to treat as gastritis/PUD. At this time, patient exhibits no emergent life-threatening condition that require further evaluation in ED or admission. Patient had ample opportunity for questions and discussion. All patient's questions were answered with full understanding. Strict return precautions discussed. Patient expresses understanding and agreement to plan.   Portions of this note were generated with [employer]. Dictation errors may occur despite best attempts at proofreading.   Final Clinical Impressions(s) / ED Diagnoses   Final diagnoses:  Epigastric abdominal pain    ED Discharge Orders         Ordered    sucralfate (CARAFATE) 1 GM/10ML suspension  3 times daily with meals & bedtime     12/04/18 1958    famotidine (PEPCID) 20 MG tablet  2 times daily     12/04/18 1958           Desma Mcgregor 12/04/18 2331    Varney Biles, MD 12/05/18 1529

## 2018-12-04 NOTE — ED Triage Notes (Signed)
Patient here from work with complaints of Chron's disease flare. Humera twice monthly. Nausea , no vomiting.

## 2018-12-04 NOTE — Discharge Instructions (Signed)
Take Carafate as directed.  Continue taking your omeprazole.  As we discussed, you may need further evaluation by her GI doctor and possible endoscopy if this pain continues.  Return the emergency department for fever, worsening abdominal pain, vomiting or any other worsening or concerning symptoms.

## 2020-03-23 ENCOUNTER — Ambulatory Visit: Payer: Self-pay | Admitting: General Surgery

## 2020-03-23 NOTE — H&P (Signed)
The patient is a 49 year old male who presents with a complaint of Mass. 49 year old male who presents to the office for evaluation of a back mass. He states that he has had this for many years. Over the past few weeks it became swollen and painful. It then opened up and drained purulent material. It has been draining for the past week. He denies any fevers. His pain is much better now that it is drained. He does have Crohn's disease and takes Humira for this.   Past Surgical History (Chanel Teressa Senter, [place]; 03/23/2020 10:22 AM) Appendectomy Colon Polyp Removal - Colonoscopy Colon Removal - Partial Resection of Small Bowel Shoulder Surgery Left. Tonsillectomy Vasectomy  Diagnostic Studies History Antonietta Jewel, CMA; 03/23/2020 10:22 AM) Colonoscopy 1-5 years ago  Allergies (Chanel Teressa Senter, CMA; 03/23/2020 10:23 AM) No Known Drug Allergies [03/23/2020]: Allergies Reconciled  Medication History (Chanel Teressa Senter, CMA; 03/23/2020 10:25 AM) Adalimumab (40MG/0.8ML Kit, Subcutaneous) Active. amLODIPine Besylate (5MG Tablet, Oral) Active. Cholestyramine (4GM Packet, Oral) Active. Pepcid (20MG Tablet, Oral) Active. Lisinopril (20MG Tablet, Oral) Active. Omeprazole (40MG Capsule DR, Oral) Active. Carafate (1GM/10ML Suspension, Oral) Active. Tadalafil (10MG Tablet, Oral) Active. Testosterone Cypionate (200MG/ML Solution, Intramuscular) Active. Medications Reconciled  Social History Antonietta Jewel, CMA; 03/23/2020 10:22 AM) Alcohol use Occasional alcohol use. Caffeine use Coffee. Illicit drug use Uses socially only. Tobacco use Former smoker.  Family History Antonietta Jewel, [place]; 03/23/2020 10:22 AM) Arthritis Mother. Bleeding disorder Mother. Cancer Mother. Cerebrovascular Accident Father. Depression Mother. Diabetes Mellitus Father. Hypertension Brother, Father, Mother. Thyroid problems Mother.  Other Problems (Chanel Teressa Senter, CMA; 03/23/2020 10:22 AM) Back  Pain Cancer Crohn's Disease Gastric Ulcer Gastroesophageal Reflux Disease Hemorrhoids High blood pressure Kidney Stone Ulcerative Colitis     Review of Systems (Chanel Nolan CMA; 03/23/2020 10:22 AM) General Not Present- Appetite Loss, Chills, Fatigue, Fever, Night Sweats, Weight Gain and Weight Loss. Skin Present- New Lesions. Not Present- Change in Wart/Mole, Dryness, Hives, Jaundice, Non-Healing Wounds, Rash and Ulcer. HEENT Present- Seasonal Allergies and Wears glasses/contact lenses. Not Present- Earache, Hearing Loss, Hoarseness, Nose Bleed, Oral Ulcers, Ringing in the Ears, Sinus Pain, Sore Throat, Visual Disturbances and Yellow Eyes. Respiratory Not Present- Bloody sputum, Chronic Cough, Difficulty Breathing, Snoring and Wheezing. Breast Not Present- Breast Mass, Breast Pain, Nipple Discharge and Skin Changes. Cardiovascular Not Present- Chest Pain, Difficulty Breathing Lying Down, Leg Cramps, Palpitations, Rapid Heart Rate, Shortness of Breath and Swelling of Extremities. Gastrointestinal Not Present- Abdominal Pain, Bloating, Bloody Stool, Change in Bowel Habits, Chronic diarrhea, Constipation, Difficulty Swallowing, Excessive gas, Gets full quickly at meals, Hemorrhoids, Indigestion, Nausea, Rectal Pain and Vomiting. Male Genitourinary Not Present- Blood in Urine, Change in Urinary Stream, Frequency, Impotence, Nocturia, Painful Urination, Urgency and Urine Leakage. Musculoskeletal Not Present- Back Pain, Joint Pain, Joint Stiffness, Muscle Pain, Muscle Weakness and Swelling of Extremities. Neurological Not Present- Decreased Memory, Fainting, Headaches, Numbness, Seizures, Tingling, Tremor, Trouble walking and Weakness. Psychiatric Not Present- Anxiety, Bipolar, Change in Sleep Pattern, Depression, Fearful and Frequent crying. Endocrine Not Present- Cold Intolerance, Excessive Hunger, Hair Changes, Heat Intolerance, Hot flashes and New Diabetes. Hematology Not Present-  Blood Thinners, Easy Bruising, Excessive bleeding, Gland problems, HIV and Persistent Infections.  Vitals (Chanel Nolan CMA; 03/23/2020 10:25 AM) 03/23/2020 10:25 AM Weight: 222.5 lb Height: 70in Body Surface Area: 2.18 m Body Mass Index: 31.93 kg/m  Temp.: 51F  Pulse: 101 (Regular)  BP: 126/82(Sitting, Left Arm, Standard)        Physical Exam Leighton Ruff MD; 01/18/3789 11:06 AM)  General Mental  Status-Alert. General Appearance-Cooperative.  Integumentary Problem #1 Location - Back - left upper(Draining wound, expresses sebaceous material, ~ 2cm).    Assessment & Plan Leighton Ruff MD; 10/28/8716 11:07 AM)  INFECTED SEBACEOUS CYST (L72.3) Impression: 49 year old male with Crohn's disease on Humira who presents to the office today for evaluation of an infected sebaceous cyst. This has been open and draining for at least a week. He has no signs of surrounding cellulitis. Wound is in his mid back. An approximately 2 cm in diameter. I have recommended that he allow the area to continue to heal over the next few weeks and then we will plan on excision in the outpatient surgery center for complete removal. We discussed that his wound healing may be slower due to his Humira and he may need external sutures on his wound. Risks include bleeding, infection, wound dehiscence and recurrence of cyst.

## 2020-04-08 ENCOUNTER — Encounter (HOSPITAL_BASED_OUTPATIENT_CLINIC_OR_DEPARTMENT_OTHER): Payer: Self-pay | Admitting: General Surgery

## 2020-04-08 NOTE — Progress Notes (Signed)
Gilbert Pierce states he needs to cancel his surgery for 04/15/2020, instructed him to call Dr. Marcello Moores' office to make them aware.

## 2020-04-12 ENCOUNTER — Inpatient Hospital Stay (HOSPITAL_COMMUNITY): Admission: RE | Admit: 2020-04-12 | Payer: BLUE CROSS/BLUE SHIELD | Source: Ambulatory Visit

## 2020-04-15 ENCOUNTER — Ambulatory Visit (HOSPITAL_BASED_OUTPATIENT_CLINIC_OR_DEPARTMENT_OTHER)
Admission: RE | Admit: 2020-04-15 | Payer: Managed Care, Other (non HMO) | Source: Home / Self Care | Admitting: General Surgery

## 2020-04-15 ENCOUNTER — Encounter (HOSPITAL_BASED_OUTPATIENT_CLINIC_OR_DEPARTMENT_OTHER): Admission: RE | Payer: Self-pay | Source: Home / Self Care

## 2020-04-15 HISTORY — DX: Non-Hodgkin lymphoma, unspecified, unspecified site: C85.90

## 2020-04-15 HISTORY — DX: Herpesviral infection, unspecified: B00.9

## 2020-04-15 HISTORY — DX: Peripheral vascular disease, unspecified: I73.9

## 2020-04-15 HISTORY — DX: Malignant neoplasm of appendix: C18.1

## 2020-04-15 SURGERY — IRRIGATION AND DEBRIDEMENT SEBACEOUS CYST
Anesthesia: Monitor Anesthesia Care

## 2020-07-02 IMAGING — CT CT ABD-PELV W/ CM
2 of 5 series · 15 of 46 positions shown, 17 images · IV contrast (ISOVUE)
Comparison: 01/28/2014

CLINICAL DATA: History of Crohn's with left upper quadrant pain
since [REDACTED].

EXAM:
CT ABDOMEN AND PELVIS WITH CONTRAST
TECHNIQUE: Multidetector CT imaging of the abdomen and pelvis was performed
using the standard protocol following bolus administration of
intravenous contrast.
CONTRAST:  30mL OMNIPAQUE IOHEXOL 300 MG/ML SOLN, 100mL 17FISP-DTT
IOPAMIDOL (17FISP-DTT) INJECTION 61%

[Series 2: axial st · axial · 0.91mm/px · z∈[+1023,+1473]mm · 12 of 104 slices shown, 14 images]
[im 7/104  soft-tissue]
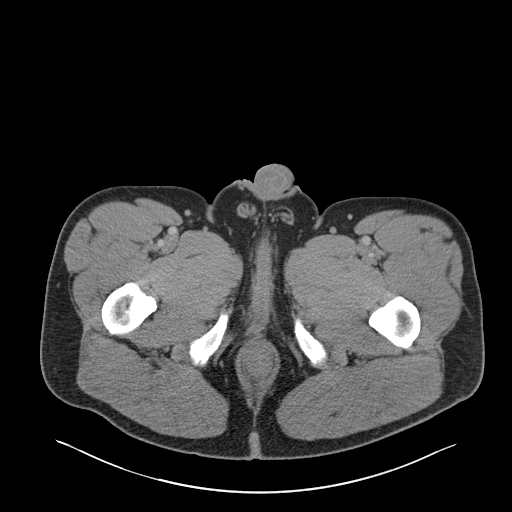
[im 7/104  bone]
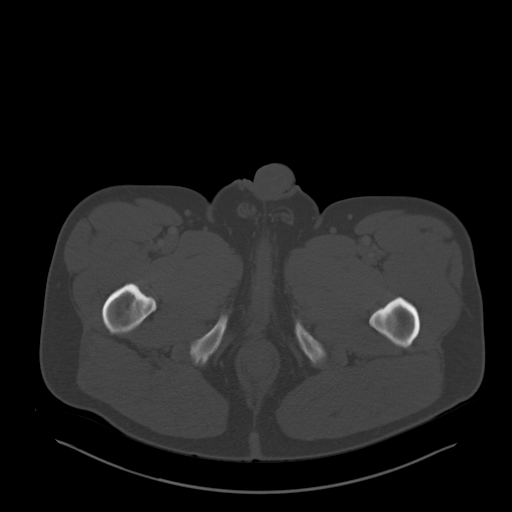
[im 13/104  soft-tissue]
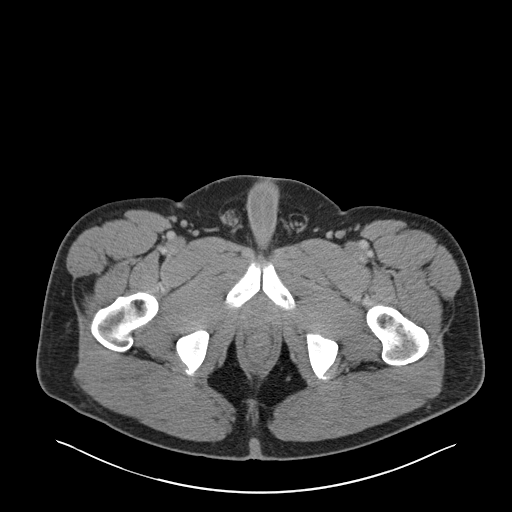
[im 26/104  soft-tissue]
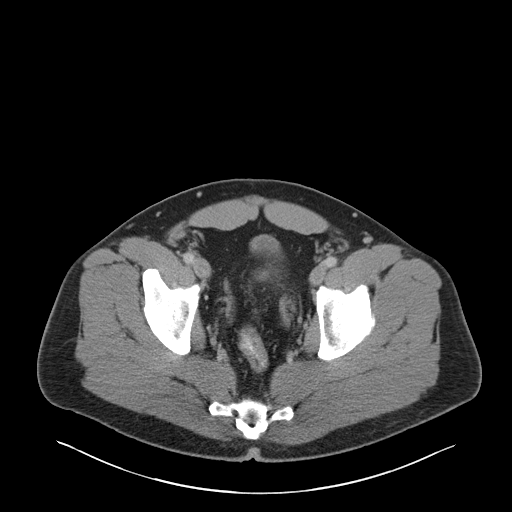
[im 33/104  soft-tissue]
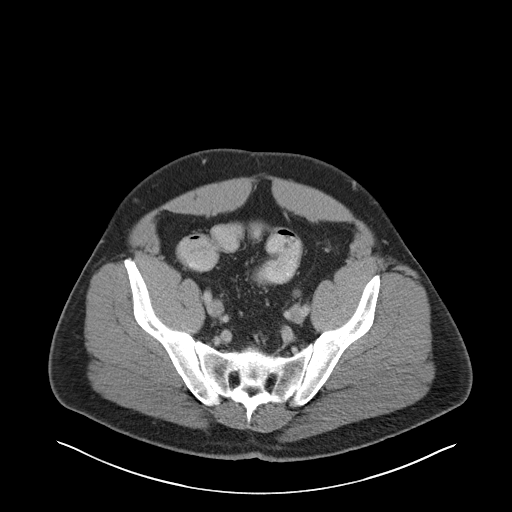
[im 39/104  soft-tissue]
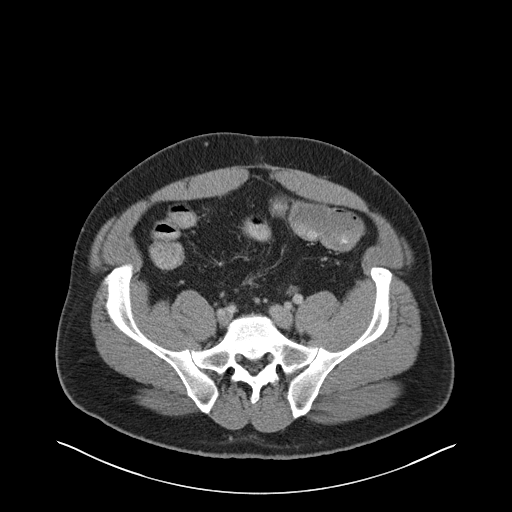
[im 46/104  soft-tissue]
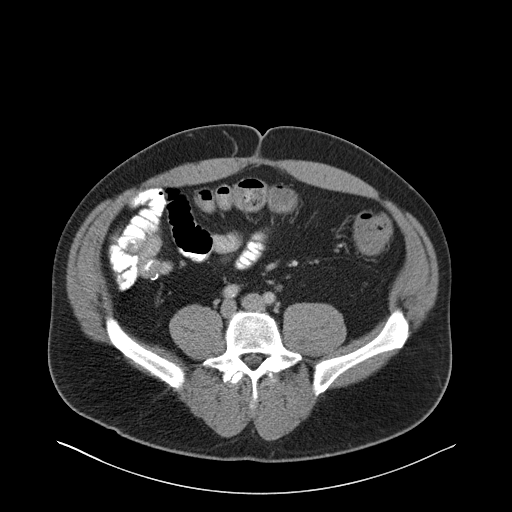
[im 58/104  soft-tissue]
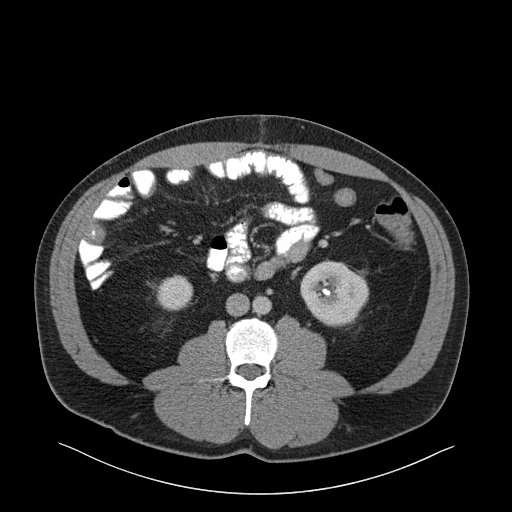
[im 65/104  soft-tissue]
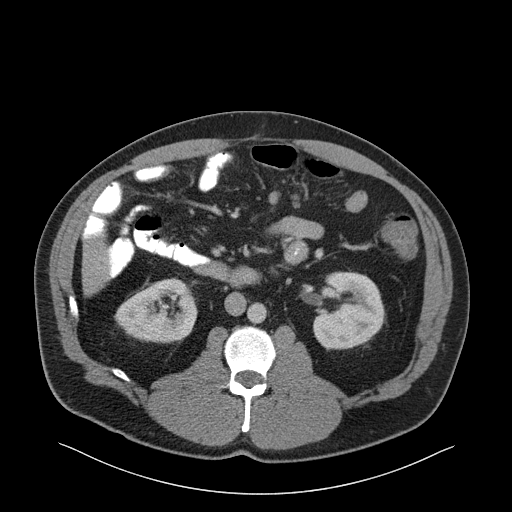
[im 71/104  soft-tissue]
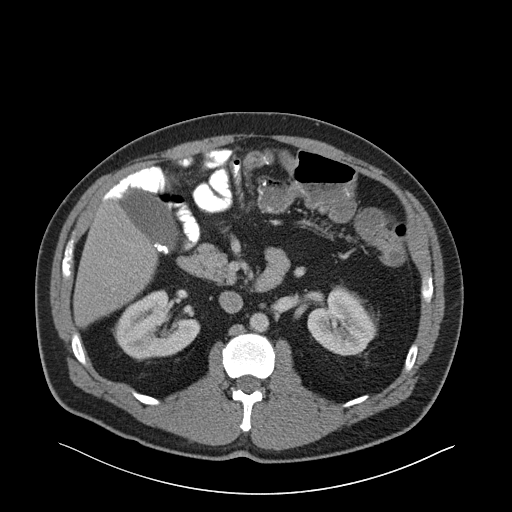
[im 71/104  bone]
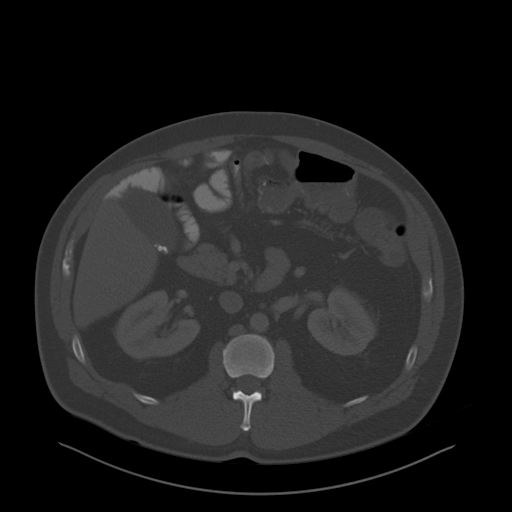
[im 78/104  soft-tissue]
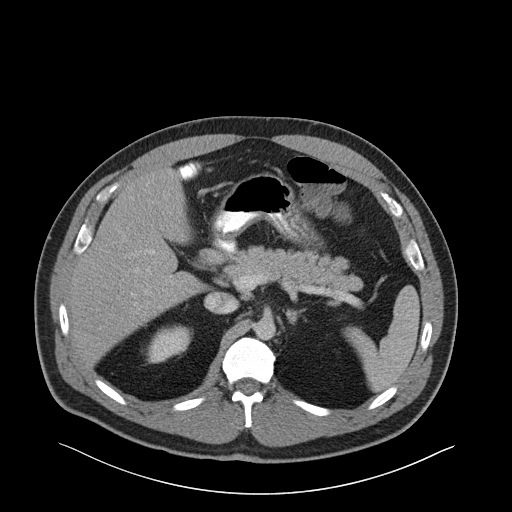
[im 91/104  soft-tissue]
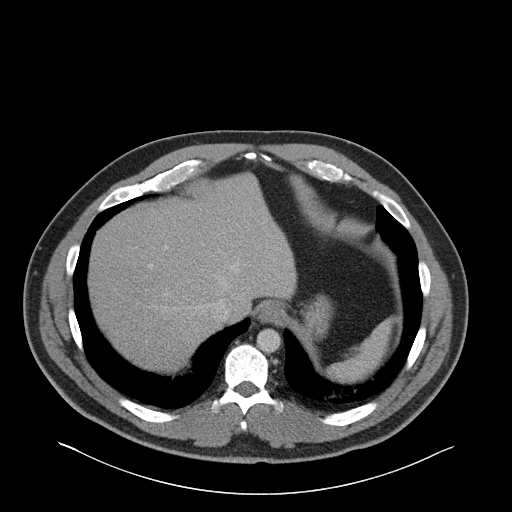
[im 97/104  soft-tissue]
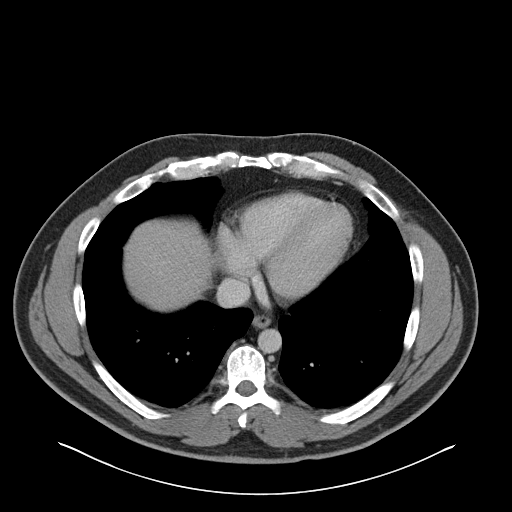

[Series 7: coronal st · coronal · 0.77mm/px · 3 of 151 slices shown]
[im 51/151  soft-tissue]
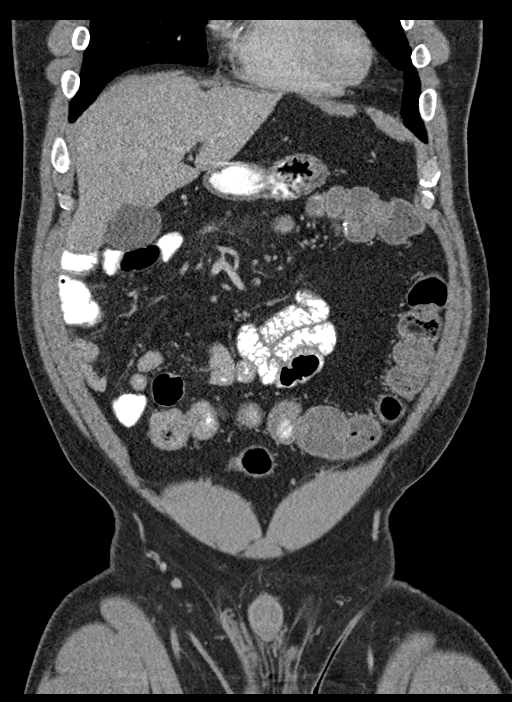
[im 67/151  soft-tissue]
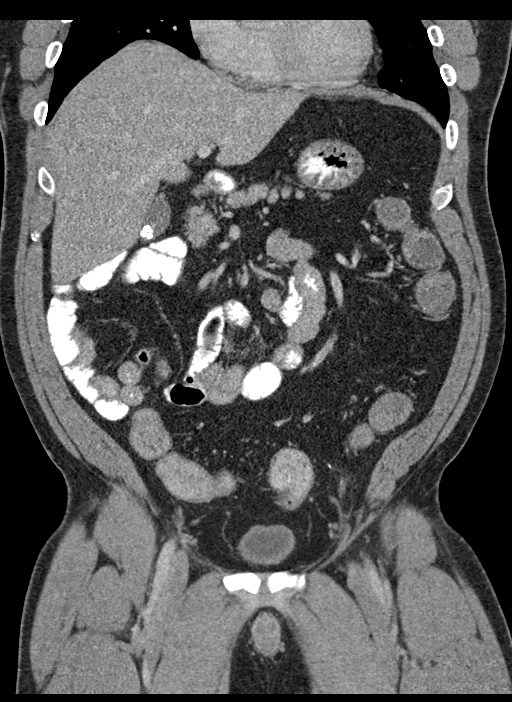
[im 84/151  soft-tissue]
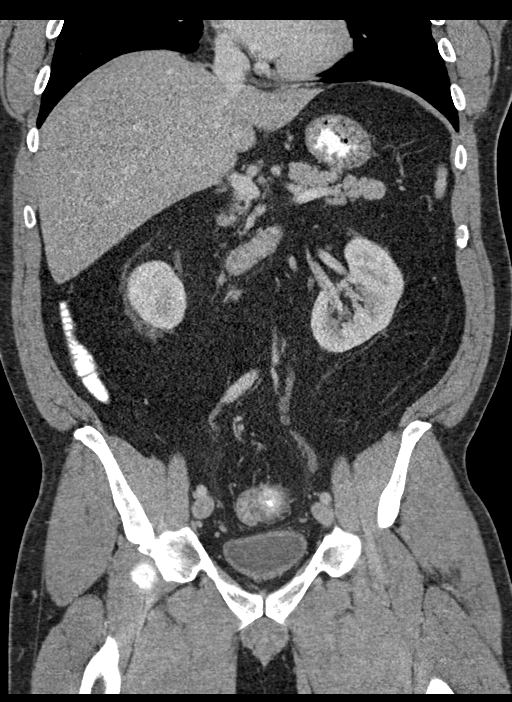

[15 of 46 positions shown; findings below may reference images not displayed]

FINDINGS: Lower chest: Borderline cardiomegaly. No pericardial effusion. No
acute pulmonary consolidation. Passive atelectasis at the left lung
base.

Hepatobiliary: Dependent gallstones without secondary signs of acute
cholecystitis. Homogeneous attenuation of the liver without
space-occupying mass. No biliary dilatation.

Pancreas: Normal

Spleen: Normal

Adrenals/Urinary Tract: Nonobstructing left lower pole renal
calculus measuring up to 7 mm. No hydroureteronephrosis. No ureteral
calculi. The urinary bladder is decompressed in appearance and
slightly thick-walled as a result.

Stomach/Bowel: The stomach is physiologically distended with
ingested oral contrast. Antegrade flow of contrast is noted within
small bowel without evidence of mechanical obstruction. No
inflammatory process identified. Surgical partial colectomy with
intact anastomotic sutures in the region of the transverse colon
without obstruction. Moderate stool retention is seen within the
colon. Luminal narrowing of the rectosigmoid, series [DATE] [DATE] [DATE]
reflect chronic changes of the patient's underlying inflammatory
bowel disease. Spasm is also possibility accounting for this
appearance.

Vascular/Lymphatic: No significant vascular findings are present. No
enlarged abdominal or pelvic lymph nodes.

Reproductive: Normal size prostate and seminal vesicles.

Other: No findings of sacroiliitis. No acute nor suspicious osseous
lesions.

Musculoskeletal: No acute or significant osseous findings.
IMPRESSION: No evidence of active hip bowel inflammation or obstruction.
Moderate stool retention within the colon.

Luminal narrowing of the rectosigmoid may simply reflect colonic
spasm. Stricture from remote inflammatory bowel disease is not
entirely excluded but believed less likely.

Uncomplicated cholelithiasis.

Nonobstructing 7 mm left lower pole renal calculus.

## 2021-04-10 ENCOUNTER — Other Ambulatory Visit: Payer: Self-pay

## 2021-04-10 ENCOUNTER — Encounter (HOSPITAL_BASED_OUTPATIENT_CLINIC_OR_DEPARTMENT_OTHER): Payer: Self-pay | Admitting: *Deleted

## 2021-04-10 ENCOUNTER — Observation Stay (HOSPITAL_BASED_OUTPATIENT_CLINIC_OR_DEPARTMENT_OTHER)
Admission: EM | Admit: 2021-04-10 | Discharge: 2021-04-11 | Disposition: A | Discharge status: Home / Self Care | Payer: Managed Care, Other (non HMO) | Attending: Internal Medicine | Admitting: Internal Medicine

## 2021-04-10 ENCOUNTER — Emergency Department (HOSPITAL_BASED_OUTPATIENT_CLINIC_OR_DEPARTMENT_OTHER): Payer: Managed Care, Other (non HMO)

## 2021-04-10 DIAGNOSIS — R778 Other specified abnormalities of plasma proteins: Secondary | ICD-10-CM | POA: Diagnosis not present

## 2021-04-10 DIAGNOSIS — Z85038 Personal history of other malignant neoplasm of large intestine: Secondary | ICD-10-CM | POA: Diagnosis not present

## 2021-04-10 DIAGNOSIS — I319 Disease of pericardium, unspecified: Secondary | ICD-10-CM | POA: Diagnosis not present

## 2021-04-10 DIAGNOSIS — I214 Non-ST elevation (NSTEMI) myocardial infarction: Secondary | ICD-10-CM

## 2021-04-10 DIAGNOSIS — R079 Chest pain, unspecified: Secondary | ICD-10-CM | POA: Diagnosis present

## 2021-04-10 DIAGNOSIS — Z8572 Personal history of non-Hodgkin lymphomas: Secondary | ICD-10-CM | POA: Diagnosis not present

## 2021-04-10 DIAGNOSIS — I10 Essential (primary) hypertension: Secondary | ICD-10-CM | POA: Insufficient documentation

## 2021-04-10 DIAGNOSIS — Z87891 Personal history of nicotine dependence: Secondary | ICD-10-CM | POA: Insufficient documentation

## 2021-04-10 DIAGNOSIS — I200 Unstable angina: Secondary | ICD-10-CM

## 2021-04-10 DIAGNOSIS — Z79899 Other long term (current) drug therapy: Secondary | ICD-10-CM | POA: Diagnosis not present

## 2021-04-10 DIAGNOSIS — I2510 Atherosclerotic heart disease of native coronary artery without angina pectoris: Principal | ICD-10-CM | POA: Insufficient documentation

## 2021-04-10 DIAGNOSIS — I514 Myocarditis, unspecified: Secondary | ICD-10-CM

## 2021-04-10 DIAGNOSIS — Z20822 Contact with and (suspected) exposure to covid-19: Secondary | ICD-10-CM | POA: Diagnosis not present

## 2021-04-10 LAB — CBC
HCT: 49.6 % (ref 39.0–52.0)
Hemoglobin: 16.3 g/dL (ref 13.0–17.0)
MCH: 29.2 pg (ref 26.0–34.0)
MCHC: 32.9 g/dL (ref 30.0–36.0)
MCV: 88.7 fL (ref 80.0–100.0)
Platelets: 297 10*3/uL (ref 150–400)
RBC: 5.59 MIL/uL (ref 4.22–5.81)
RDW: 13 % (ref 11.5–15.5)
WBC: 9.2 10*3/uL (ref 4.0–10.5)
nRBC: 0 % (ref 0.0–0.2)

## 2021-04-10 LAB — MAGNESIUM: Magnesium: 2 mg/dL (ref 1.7–2.4)

## 2021-04-10 LAB — CBC WITH DIFFERENTIAL/PLATELET
Abs Immature Granulocytes: 0.13 10*3/uL — ABNORMAL HIGH (ref 0.00–0.07)
Basophils Absolute: 0.1 10*3/uL (ref 0.0–0.1)
Basophils Relative: 1 %
Eosinophils Absolute: 0.3 10*3/uL (ref 0.0–0.5)
Eosinophils Relative: 3 %
HCT: 49.3 % (ref 39.0–52.0)
Hemoglobin: 16.8 g/dL (ref 13.0–17.0)
Immature Granulocytes: 1 %
Lymphocytes Relative: 39 %
Lymphs Abs: 3.8 10*3/uL (ref 0.7–4.0)
MCH: 30.4 pg (ref 26.0–34.0)
MCHC: 34.1 g/dL (ref 30.0–36.0)
MCV: 89.3 fL (ref 80.0–100.0)
Monocytes Absolute: 0.9 10*3/uL (ref 0.1–1.0)
Monocytes Relative: 9 %
Neutro Abs: 4.4 10*3/uL (ref 1.7–7.7)
Neutrophils Relative %: 47 %
Platelets: 317 10*3/uL (ref 150–400)
RBC: 5.52 MIL/uL (ref 4.22–5.81)
RDW: 13.2 % (ref 11.5–15.5)
WBC: 9.6 10*3/uL (ref 4.0–10.5)
nRBC: 0 % (ref 0.0–0.2)

## 2021-04-10 LAB — CREATININE, SERUM
Creatinine, Ser: 1.1 mg/dL (ref 0.61–1.24)
GFR, Estimated: >60 mL/min (ref >60)

## 2021-04-10 LAB — C-REACTIVE PROTEIN: CRP: 1.5 mg/dL — ABNORMAL HIGH (ref <1.0)

## 2021-04-10 LAB — BASIC METABOLIC PANEL
Anion gap: 8 (ref 5–15)
BUN: 16 mg/dL (ref 6–20)
CO2: 27 mmol/L (ref 22–32)
Calcium: 9.1 mg/dL (ref 8.9–10.3)
Chloride: 101 mmol/L (ref 98–111)
Creatinine, Ser: 1.19 mg/dL (ref 0.61–1.24)
GFR, Estimated: >60 mL/min (ref >60)
Glucose, Bld: 119 mg/dL — ABNORMAL HIGH (ref 70–99)
Potassium: 4.6 mmol/L (ref 3.5–5.1)
Sodium: 136 mmol/L (ref 135–145)

## 2021-04-10 LAB — HIV ANTIBODY (ROUTINE TESTING W REFLEX): HIV Screen 4th Generation wRfx: NONREACTIVE

## 2021-04-10 LAB — T4, FREE: Free T4: 1.03 ng/dL (ref 0.61–1.12)

## 2021-04-10 LAB — HEMOGLOBIN A1C
Hgb A1c MFr Bld: 5.7 % — ABNORMAL HIGH (ref 4.8–5.6)
Mean Plasma Glucose: 116.89 mg/dL

## 2021-04-10 LAB — HEPATIC FUNCTION PANEL
ALT: 55 U/L — ABNORMAL HIGH (ref 0–44)
AST: 35 U/L (ref 15–41)
Albumin: 3.6 g/dL (ref 3.5–5.0)
Alkaline Phosphatase: 54 U/L (ref 38–126)
Bilirubin, Direct: 0.2 mg/dL (ref 0.0–0.2)
Indirect Bilirubin: 0.9 mg/dL (ref 0.3–0.9)
Total Bilirubin: 1.1 mg/dL (ref 0.3–1.2)
Total Protein: 7.3 g/dL (ref 6.5–8.1)

## 2021-04-10 LAB — SEDIMENTATION RATE: Sed Rate: 5 mm/hr (ref 0–16)

## 2021-04-10 LAB — TROPONIN I (HIGH SENSITIVITY)
Troponin I (High Sensitivity): 3 ng/L (ref <18)
Troponin I (High Sensitivity): 113 ng/L (ref <18)
Troponin I (High Sensitivity): 253 ng/L (ref <18)
Troponin I (High Sensitivity): 445 ng/L (ref <18)

## 2021-04-10 LAB — RESP PANEL BY RT-PCR (FLU A&B, COVID) ARPGX2
Influenza A by PCR: NEGATIVE
Influenza B by PCR: NEGATIVE
SARS Coronavirus 2 by RT PCR: NEGATIVE

## 2021-04-10 LAB — TSH: TSH: 1.65 u[IU]/mL (ref 0.350–4.500)

## 2021-04-10 MED ORDER — METOPROLOL TARTRATE 25 MG PO TABS: 25.0000 mg | ORAL_TABLET | Freq: Two times a day (BID) | ORAL | Status: DC

## 2021-04-10 MED ORDER — ASPIRIN EC 81 MG PO TBEC: 81.0000 mg | DELAYED_RELEASE_TABLET | Freq: Every day | ORAL | Status: DC

## 2021-04-10 MED ORDER — AMOXICILLIN 500 MG PO CAPS
500.0000 mg | ORAL_CAPSULE | Freq: Three times a day (TID) | ORAL | Status: DC
  Administered 2021-04-10 – 2021-04-11 (×4): 500 mg via ORAL
  Filled 2021-04-10 (×6): qty 1

## 2021-04-10 MED ORDER — ASPIRIN 81 MG PO CHEW
324.0000 mg | CHEWABLE_TABLET | Freq: Once | ORAL | Status: AC
  Administered 2021-04-10: 324 mg via ORAL
  Filled 2021-04-10: qty 4

## 2021-04-10 MED ORDER — SODIUM CHLORIDE 0.9 % IV SOLN: 250.0000 mL | INTRAVENOUS | Status: DC | PRN

## 2021-04-10 MED ORDER — AMLODIPINE BESYLATE 5 MG PO TABS
5.0000 mg | ORAL_TABLET | Freq: Every day | ORAL | Status: DC
  Administered 2021-04-11: 5 mg via ORAL
  Filled 2021-04-10: qty 1

## 2021-04-10 MED ORDER — ACETAMINOPHEN 325 MG PO TABS: 650.0000 mg | ORAL_TABLET | ORAL | Status: DC | PRN

## 2021-04-10 MED ORDER — AMLODIPINE BESYLATE 5 MG PO TABS
5.0000 mg | ORAL_TABLET | Freq: Once | ORAL | Status: AC
  Administered 2021-04-10: 5 mg via ORAL
  Filled 2021-04-10: qty 1

## 2021-04-10 MED ORDER — METOPROLOL TARTRATE 50 MG PO TABS
50.0000 mg | ORAL_TABLET | Freq: Once | ORAL | Status: DC
  Filled 2021-04-10: qty 1

## 2021-04-10 MED ORDER — ONDANSETRON HCL 4 MG/2ML IJ SOLN: 4.0000 mg | Freq: Four times a day (QID) | INTRAMUSCULAR | Status: DC | PRN

## 2021-04-10 MED ORDER — LISINOPRIL 20 MG PO TABS
20.0000 mg | ORAL_TABLET | Freq: Every day | ORAL | Status: DC
  Administered 2021-04-10 – 2021-04-11 (×2): 20 mg via ORAL
  Filled 2021-04-10 (×2): qty 1

## 2021-04-10 MED ORDER — CHOLESTYRAMINE 4 G PO PACK
4.0000 g | PACK | Freq: Every day | ORAL | Status: DC
  Administered 2021-04-10: 4 g via ORAL
  Filled 2021-04-10 (×2): qty 1

## 2021-04-10 MED ORDER — ATORVASTATIN CALCIUM 40 MG PO TABS
40.0000 mg | ORAL_TABLET | Freq: Every day | ORAL | Status: DC
Start: 2021-04-10 — End: 2021-04-11
  Administered 2021-04-11: 40 mg via ORAL
  Filled 2021-04-10 (×3): qty 1

## 2021-04-10 MED ORDER — HEPARIN SODIUM (PORCINE) 5000 UNIT/ML IJ SOLN
5000.0000 [IU] | Freq: Three times a day (TID) | INTRAMUSCULAR | Status: DC
  Administered 2021-04-10 – 2021-04-11 (×2): 5000 [IU] via SUBCUTANEOUS
  Filled 2021-04-10 (×2): qty 1

## 2021-04-10 MED ORDER — SODIUM CHLORIDE 0.9% FLUSH
3.0000 mL | Freq: Two times a day (BID) | INTRAVENOUS | Status: DC
  Administered 2021-04-10 – 2021-04-11 (×2): 3 mL via INTRAVENOUS

## 2021-04-10 MED ORDER — ZOLPIDEM TARTRATE 5 MG PO TABS
5.0000 mg | ORAL_TABLET | Freq: Every evening | ORAL | Status: DC | PRN
  Administered 2021-04-10: 5 mg via ORAL
  Filled 2021-04-10: qty 1

## 2021-04-10 MED ORDER — METOPROLOL TARTRATE 12.5 MG HALF TABLET
12.5000 mg | ORAL_TABLET | Freq: Two times a day (BID) | ORAL | Status: DC
  Administered 2021-04-10 – 2021-04-11 (×2): 12.5 mg via ORAL
  Filled 2021-04-10 (×2): qty 1

## 2021-04-10 MED ORDER — PANTOPRAZOLE SODIUM 40 MG PO TBEC
40.0000 mg | DELAYED_RELEASE_TABLET | Freq: Every day | ORAL | Status: DC
  Administered 2021-04-10 – 2021-04-11 (×2): 40 mg via ORAL
  Filled 2021-04-10 (×3): qty 1

## 2021-04-10 MED ORDER — SODIUM CHLORIDE 0.9% FLUSH: 3.0000 mL | INTRAVENOUS | Status: DC | PRN

## 2021-04-10 MED ORDER — NITROGLYCERIN 0.4 MG SL SUBL: 0.4000 mg | SUBLINGUAL_TABLET | SUBLINGUAL | Status: DC | PRN

## 2021-04-10 NOTE — ED Notes (Signed)
Spoke with Abbe Amsterdam at [facility] regarding admit to Dr Caryl Comes

## 2021-04-10 NOTE — Progress Notes (Signed)
Pharmacy Antibiotic Note  Gilbert Pierce is a 50 y.o. male admitted on 04/10/2021 with URI.  Pharmacy has been consulted for amoxicillin dosing.  Patient was started on amoxicillin 875mg  BID on 04/07/21 PTA for 10 day course. We do not have that dose available so will adjust to 500mg  TID. Good renal function. WBC wnl.   Plan: Amoxicillin 500mg  TID until 7/16  Height: 5\' 11"  (180.3 cm) Weight: 93.9 kg (207 lb 0.2 oz) (210.0 pounds) IBW/kg (Calculated) : 75.3  Temp (24hrs), Avg:98.3 F (36.8 C), Min:98.2 F (36.8 C), Max:98.3 F (36.8 C)  Recent Labs  Lab 04/10/21 0921  WBC 9.6  CREATININE 1.19    Estimated Creatinine Clearance: 85.9 mL/min (by C-G formula based on SCr of 1.19 mg/dL).    No Known Allergies   Thank you for allowing pharmacy to be a part of this patient's care.  Benetta Spar, PharmD, BCPS, BCCP Clinical Pharmacist  Please check AMION for all [place] phone numbers After 10:00 PM, call Brenda [phone]

## 2021-04-10 NOTE — ED Notes (Signed)
Phone Handoff Report provided to Sarah-RN at [place] / 6E - Cardiac

## 2021-04-10 NOTE — H&P (Addendum)
Cardiology Admission History and Physical:   Patient ID: Gilbert Pierce MRN: [id number]; DOB: 07-06-1971   Admission date: 04/10/2021  PCP:  Wenda Low, MD   [facility] Providers Cardiologist:  None        Chief Complaint:  chest pain   Patient Profile:   Gilbert Pierce is a 50 y.o. male with hx of Crohn's disease followed by [facility], GERD, HLD, HTN, PAD Raynaud's disease, Cancer of appendix, non Hodgkins's lymphoma who is being seen 04/10/2021 for the evaluation of chest pain.  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   Today presented to [facility] after he developed chest pressure that began this AM.  Rated 5/10.  He tried to belch but no improvement.  He took Cialis yesterday.   Now pain comes and goes.  Currently pain free.    Echo in 2016 with EF 56% mild concentric LVH, no significant valve issues.   EKG:  The ECGs that was done 04/10/21  were personally reviewed and demonstrates SR with early repol vs ST elevation.  No acute changes from EKG in 2018.   hs troponin 3, 113 and then 253 Na 136, K+ 4.6, BUN 16, Cr 1.19, Hgb 16.8 WBC 9.6 Hgb 16.8 plts 317  COVID neg PCXR IMPRESSION: No active disease. No evidence of pneumonia or pulmonary edema.   Currently just arrived from [facility].  BP 145/100 P75 afebrile R 16   Past Medical History:  Diagnosis Date   Cancer of appendix ([place])    Crohn disease ([place])    Dyslipidemia    GERD (gastroesophageal reflux disease)    Herpes simplex    Hypertension    Non Hodgkin's lymphoma ([place])    Peripheral vascular disease ([place])    Left hand ischemia, Left hand small vascular disease, Left ulnar aneurysm with distal occlusion   Raynaud's  disease     Past Surgical History:  Procedure Laterality Date   ARCH AORTOGRAM  02/22/2015   Procedure: Arch Aortogram;  Surgeon: Angelia Mould, MD;  Location: [place] [facility];  Service: Cardiovascular;;   COLONOSCOPY  2018   EXPLORATORY LAPAROTOMY     Small intestine surgery    PERIPHERAL VASCULAR CATHETERIZATION Left 02/22/2015   Procedure: Upper Extremity Angiography;  Surgeon: Angelia Mould, MD;  Location: [place] [facility];  Service: Cardiovascular;  Laterality: Left;   REFRACTIVE SURGERY     SHOULDER ARTHROSCOPY W/ SUPERIOR LABRAL ANTERIOR POSTERIOR REPAIR     ULTRASOUND GUIDANCE FOR VASCULAR ACCESS Right 02/22/2015   Procedure: Ultrasound Guidance For Vascular Access;  Surgeon: Angelia Mould, MD;  Location: [place] [facility];  Service: Cardiovascular;  Laterality: Right;     Medications Prior to Admission: Prior to Admission medications   Medication Sig Start Date End Date Taking? Authorizing Provider  Adalimumab 40 MG/0.8ML PSKT Inject 40 mg into the skin every 14 (fourteen) days.    [provider]  amLODipine (NORVASC) 5 MG tablet Take 5 mg by mouth daily. 11/18/14   [provider]  celecoxib (CELEBREX) 200 MG capsule Take 200 mg by mouth daily as needed for mild pain.    [provider]  cholestyramine (QUESTRAN) 4 g  packet Take 4 g by mouth at bedtime.  12/02/18   [provider]  cyanocobalamin (,VITAMIN B-12,) 1000 MCG/ML injection Inject 1,000 mcg into the skin every 21 ( twenty-one) days.  03/24/17   [provider]  famotidine (PEPCID) 20 MG tablet Take 1 tablet (20 mg total) by mouth 2 (two) times daily. 12/04/18   Volanda Napoleon, PA-C  Ginkgo Biloba 40 MG TABS Take 1 tablet by mouth daily.    [provider]  ibuprofen (ADVIL,MOTRIN) 800 MG tablet Take 1 tablet (800 mg total) by mouth every 8 (eight) hours as needed. Patient not taking: Reported on 12/04/2018 06/08/17   Quintella Reichert, MD   lisinopril (PRINIVIL,ZESTRIL) 20 MG tablet Take 20 mg by mouth daily. 12/23/14   [provider]  magnesium 30 MG tablet Take 30 mg by mouth daily.    [provider]  Multiple Vitamin (MULTIVITAMIN WITH MINERALS) TABS tablet Take 1 tablet by mouth daily.    [provider]  omeprazole (PRILOSEC) 40 MG capsule Take 40 mg by mouth daily. 11/16/14   [provider]  sucralfate (CARAFATE) 1 GM/10ML suspension Take 10 mLs (1 g total) by mouth 4 (four) times daily -  with meals and at bedtime. 12/04/18   Volanda Napoleon, PA-C  tadalafil (CIALIS) 10 MG tablet Take 5 mg by mouth daily. E.D.     [provider]  testosterone cypionate (DEPOTESTOSTERONE CYPIONATE) 200 MG/ML injection Inject 200 mg into the muscle every 14 (fourteen) days. 05/07/17   [provider]     Allergies:   No Known Allergies  Social History:   Social History   Socioeconomic History   Marital status: Married    Spouse name: Not on file   Number of children: 2   Years of education: Not on file   Highest education level: Not on file  Occupational History   Not on file  Tobacco Use   Smoking status: Former    Pack years: 0.00    Types: Cigarettes    Start date: 02/16/2013   Smokeless tobacco: Never  Vaping Use   Vaping Use: Never used  Substance and Sexual Activity   Alcohol use: Yes    Alcohol/week: 3.0 - 6.0 standard drinks    Types: 1 - 2 Glasses of wine, 1 - 2 Cans of beer, 1 - 2 Shots of liquor per week   Drug use: Yes    Types: Marijuana   Sexual activity: Yes  Other Topics Concern   Not on file  Social History Narrative   Not on file   Social Determinants of Health   Financial Resource Strain: Not on file  Food Insecurity: Not on file  Transportation Needs: Not on file  Physical Activity: Not on file  Stress: Not on file  Social Connections: Not on file  Intimate Partner Violence: Not on file    Family History:   The patient's family history  includes Cancer in his mother; Heart disease in his father and mother; Hyperlipidemia in his father and mother; Hypertension in his brother, father, and mother.  Father with hx of CABG at 34, unsure of onset of symptomatic CAD.   ROS:  Please see the history of present illness.  General:no colds or fevers, recent URI on placed on amoxicillin  no weight changes Skin:no rashes or ulcers HEENT:no blurred vision, no congestion CV:see HPI PUL:see HPI GI:no diarrhea constipation or melena, no indigestion + Chron's in remission GU:no hematuria, no dysuria, +  Crohn's disease MS:no joint pain, no claudication Neuro:no syncope, + lightheadedness while playing golf, flet he was overheated.  Endo:no diabetes, no thyroid disease  All other ROS reviewed and negative.     Physical Exam/Data:   Vitals:   04/10/21 1245 04/10/21 1251 04/10/21 1304 04/10/21 1342  BP: (!) 146/101 (!) 138/109 (!) 148/97 (!) 142/85  Pulse: 71 70  72  Resp: 17 16  16   Temp:      TempSrc:      SpO2: 97% 98%  98%  Weight:      Height:       No intake or output data in the 24 hours ending 04/10/21 1420 Last 3 Weights 04/10/2021 12/04/2018 06/08/2017  Weight (lbs) 207 lb 245 lb 227 lb  Weight (kg) 93.895 kg 111.131 kg 102.967 kg     Body mass index is 29.7 kg/m.  General:  Well nourished, well developed, in no acute distress HEENT: normal Lymph: no adenopathy Neck: no JVD Endocrine:  No thryomegaly Vascular: No carotid bruits; post tib  pulses 2+ bilaterally   Cardiac:  normal S1, S2; RRR; no murmur gallup rub or click Lungs:  clear to auscultation bilaterally, no wheezing, rhonchi or rales  Abd: soft, nontender, no hepatomegaly  Ext: no lower ext edema Musculoskeletal:  No deformities, BUE and BLE strength normal and equal Skin: warm and dry  Neuro:  CNs 2-12 intact, no focal abnormalities noted Psych:  Normal affect    Relevant CV Studies: Upper ext aortogram  2016 FINDINGS:   1. The aortic arch, innominate  artery, bilateral common carotid arteries, bilateral external carotid arteries, bilateral internal carotid arteries, bilateral vertebral arteries, and bilateral subclavian arteries are all widely patent without evidence of atherosclerotic disease. 2. The left axillary, brachial, radial, and ulnar arteries are patent. 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 The ulnar artery is occluded just above the wrist. He has diffuse disease of the digital vessels in the 2 dominant digital vessels are the lateral digital vessel to the index finger and the lateral digital vessel to the small finger. Both the medial and lateral digital vessels to the middle finger are occluded which is the finger with the most significant problems.   Echo 2016 SUMMARY  There is mild concentric left ventricular hypertrophy.  The left ventricular size is normal.   Left ventricular systolic function is normal.  LV ejection fraction = 56%.   Left ventricular filling pattern is normal.  No segmental wall motion abnormalities seen in the left ventricle  The left atrium is mildly dilated.  There is no significant valvular stenosis or regurgitation  There is no pericardial effusion.  There is no comparison study available.  -  FINDINGS:   LEFT VENTRICLE  There is mild concentric left ventricular hypertrophy. The left ventricular  size is normal. Left ventricular systolic function is normal. LV ejection  fraction = 56%. Left ventricular filling pattern is normal. No segmental wall  motion abnormalities seen in the left ventricle.  -   RIGHT VENTRICLE  The right ventricle is normal size. The right ventricular systolic function  is normal.   LEFT ATRIUM  The left atrium is mildly dilated.   RIGHT ATRIUM   Right atrial size is normal.  -  AORTIC VALVE  Structurally normal aortic valve. There is no aortic stenosis. There is no  aortic regurgitation.  -  MITRAL VALVE  The mitral valve leaflets appear normal. There is no mitral  regurgitation  noted.  -  TRICUSPID VALVE  Structurally normal tricuspid valve. There is trace tricuspid regurgitation.  -  PULMONIC VALVE  Structurally normal pulmonic valve. There is no pulmonic valvular  regurgitation.  -  ARTERIES  The aortic root is normal size.  -  VENOUS  Pulmonary venous flow pattern not well visualized. IVC size was normal.  -  EFFUSION  There is no pericardial effusion.  -  -  Laboratory Data:  High Sensitivity Troponin:   Recent Labs  Lab 04/10/21 0921 04/10/21 1136  TROPONINIHS 3 113*      Chemistry Recent Labs  Lab 04/10/21 0921  NA 136  K 4.6  CL 101  CO2 27  GLUCOSE 119*  BUN 16  CREATININE 1.19  CALCIUM 9.1  GFRNONAA >60  ANIONGAP 8    No results for input(s): PROT, ALBUMIN, AST, ALT, ALKPHOS, BILITOT in the last 168 hours. Hematology Recent Labs  Lab 04/10/21 0921  WBC 9.6  RBC 5.52  HGB 16.8  HCT 49.3  MCV 89.3  MCH 30.4  MCHC 34.1  RDW 13.2  PLT 317   BNPNo results for input(s): BNP, PROBNP in the last 168 hours.  DDimer No results for input(s): DDIMER in the last 168 hours.   Radiology/Studies:  DG Chest Portable 1 View  Result Date: 04/10/2021 CLINICAL DATA:  Chest pressure, sudden onset. EXAM: PORTABLE CHEST 1 VIEW COMPARISON:  Chest x-ray dated 06/08/2017. FINDINGS: Heart size and mediastinal contours are within normal limits. Lungs are clear. No pleural effusion or pneumothorax is seen. Osseous structures about the chest are unremarkable. IMPRESSION: No active disease. No evidence of pneumonia or pulmonary edema. Electronically Signed   By: Franki Cabot M.D.   On: 04/10/2021 09:41     Assessment and Plan:   Unstable angina/myocarditis with elevated troponin possible myopericarditis .  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 Crohn's disease followed at [facility] hold adalimumab until he is discharged. Hx of non hodgkins lymphoma  URI has been on amoxicillin ? Dose will continue he has 3 days out of 10, will use 250 every 8 hours.    Risk Assessment/Risk Scores:           Severity of Illness: The appropriate patient status for this patient is OBSERVATION. Observation status is judged to be reasonable and necessary in order to provide the required intensity of service to ensure the patient's safety. The patient's presenting symptoms, physical exam findings, and initial radiographic and laboratory data in the context of their medical condition is felt to place them at decreased risk for further clinical deterioration. Furthermore, it is anticipated that the patient will be medically stable for discharge from the hospital within 2 midnights of admission. The following factors support the patient status of observation.   " The patient's presenting symptoms include chest pain . " The physical exam findings include none. " The initial radiographic and laboratory data are EKG no acute changes , elevated troponin. .   For questions or updates, please contact [place] Please consult [email or web address] for contact info under     Signed, Cecilie Kicks, NP  04/10/2021 2:20 PM   I have seen and examined the patient along with Cecilie Kicks, NP .  I have reviewed the chart, notes  and new data.  I agree with PA/NP's note.  Key new complaints: currently pain free, but this has come and gone a couple of times today. Discomfort was dull and heavy, not positional or pleuritic (also not exertional). "Fighting off a cold" for a couple of weeks, on amoxicillin. Unusual history of arterial insufficiency in fingers of left hand and ulnar artery aneurysm in 2016, residual Raynaud's syndrome. Has Crohn's disease, quiescent. Key examination changes: normal exam, no gallop  or pericardial rub. Scar of left ulnar artery surgery with good pulses and capillary refill of the fingers. Key new findings / data: ECG shows mild and diffuse ST elevation, possibly pericarditis, but pretty similar to 2018 tracing. Incidental note of absence of atherosclerotic calcification in the aorta, visceral and pelvic arteries on 2021 CT abd & pelvis. Very mild, but progressive increase in troponin, trajectory consistent with an event that occurred around the time of onset of symptoms this morning. Normal CRP in February.  PLAN: Symtoms more consistent with angina, rather than pericarditis. Nonspecific ST elevation may be chronic. - Check echo for wall motion and pericardial abnormalities. - Check coronary CTA for possible CAD. - prefer beta blocker for HTN since will help reduce HR for CTA and may be beneficial if this is coronary insufficiency - hold off IV heparin unless he has recurrent pain or worsening ECG changes - check inflammatory markers, lipid profile and repeat troponin in AM.  Sanda Klein, MD, [facility] [phone] 04/10/2021, 5:16 PM

## 2021-04-10 NOTE — ED Notes (Signed)
Phone Handoff Report provided to [facility] with [employer]

## 2021-04-10 NOTE — ED Notes (Signed)
States having mid sternal chest pressure, ECG immediately repeated and to the ED MD for evaluation, skin noted to be moist, resp even and non-labored. NSR appears to be noted on cont cardiac monitoring

## 2021-04-10 NOTE — ED Notes (Signed)
Has not taken his Medications this am for his HTN

## 2021-04-10 NOTE — ED Provider Notes (Signed)
[facility] EMERGENCY DEPARTMENT Provider Note   CSN: [id number] Arrival date & time: 04/10/21  4888     History Chief Complaint  Patient presents with   Chest Pain    Gilbert Pierce is a 50 y.o. male.  HPI 50 year old male presents with chest pressure.  Started about an hour ago.  He has not anything special prior to it starting.  Feels like a pressure in his chest and is rated as about a 5 out of 10.  Some pain in his back that feels like gas.  He tried to belch as it feels like this would make him feel better but he has not had any relief.  No shortness of breath but he was a little clammy when it started.  No abdominal pain.  He had some tingling in his hand/fingers.  No leg swelling.  He did not take his blood pressure medicines this morning.  He most recently took a Cialis yesterday morning as he takes 1 daily.  Past Medical History:  Diagnosis Date   Cancer of appendix ([place])    Crohn disease ([place])    Dyslipidemia    GERD (gastroesophageal reflux disease)    Herpes simplex    Hypertension    Non Hodgkin's lymphoma ([place])    Peripheral vascular disease ([employer])    Left hand ischemia, Left hand small vascular disease, Left ulnar aneurysm with distal occlusion   Raynaud's disease     Patient Active Problem List   Diagnosis Date Noted   Elevated troponin 04/10/2021   Raynaud's syndrome 02/22/2015    Past Surgical History:  Procedure Laterality Date   ARCH AORTOGRAM  02/22/2015   Procedure: Clydie Braun;  Surgeon: Angelia Mould, MD;  Location: [facility];  Service: Cardiovascular;;   COLONOSCOPY  2018   EXPLORATORY LAPAROTOMY     Small intestine surgery    PERIPHERAL VASCULAR CATHETERIZATION Left 02/22/2015   Procedure: Upper Extremity Angiography;  Surgeon: Angelia Mould, MD;  Location: [facility];  Service: Cardiovascular;  Laterality: Left;   REFRACTIVE SURGERY     SHOULDER ARTHROSCOPY W/ SUPERIOR LABRAL ANTERIOR POSTERIOR  REPAIR     ULTRASOUND GUIDANCE FOR VASCULAR ACCESS Right 02/22/2015   Procedure: Ultrasound Guidance For Vascular Access;  Surgeon: Angelia Mould, MD;  Location: [facility];  Service: Cardiovascular;  Laterality: Right;       Family History  Problem Relation Age of Onset   Cancer Mother    Heart disease Mother    Hyperlipidemia Mother    Hypertension Mother    Heart disease Father    Hyperlipidemia Father    Hypertension Father    Hypertension Brother     Social History   Tobacco Use   Smoking status: Former    Pack years: 0.00    Types: Cigarettes    Start date: 02/16/2013   Smokeless tobacco: Never  Vaping Use   Vaping Use: Never used  Substance Use Topics   Alcohol use: Yes    Alcohol/week: 3.0 - 6.0 standard drinks    Types: 1 - 2 Glasses of wine, 1 - 2 Cans of beer, 1 - 2 Shots of liquor per week   Drug use: Yes    Types: Marijuana    Home Medications Prior to Admission medications   Medication Sig Start Date End Date Taking? Authorizing Provider  Adalimumab 40 MG/0.8ML PSKT Inject 40 mg into the skin every 14 (fourteen) days.    [provider]  amLODipine (NORVASC) 5 MG tablet Take 5 mg by mouth daily. 11/18/14   [provider]  celecoxib (CELEBREX) 200 MG capsule Take 200 mg by mouth daily as needed for mild pain.    [provider]  cholestyramine (QUESTRAN) 4 g packet Take 4 g by mouth at bedtime.  12/02/18   [provider]  cyanocobalamin (,VITAMIN B-12,) 1000 MCG/ML injection Inject 1,000 mcg into the skin every 21 ( twenty-one) days.  03/24/17   [provider]  famotidine (PEPCID) 20 MG tablet Take 1 tablet (20 mg total) by mouth 2 (two) times daily. 12/04/18   Volanda Napoleon, PA-C  Ginkgo Biloba 40 MG TABS Take 1 tablet by mouth daily.    [provider]  ibuprofen (ADVIL,MOTRIN) 800 MG tablet Take 1 tablet (800 mg total) by mouth every 8 (eight) hours as needed. Patient not taking:  Reported on 12/04/2018 06/08/17   Quintella Reichert, MD  lisinopril (PRINIVIL,ZESTRIL) 20 MG tablet Take 20 mg by mouth daily. 12/23/14   [provider]  magnesium 30 MG tablet Take 30 mg by mouth daily.    [provider]  Multiple Vitamin (MULTIVITAMIN WITH MINERALS) TABS tablet Take 1 tablet by mouth daily.    [provider]  omeprazole (PRILOSEC) 40 MG capsule Take 40 mg by mouth daily. 11/16/14   [provider]  sucralfate (CARAFATE) 1 GM/10ML suspension Take 10 mLs (1 g total) by mouth 4 (four) times daily -  with meals and at bedtime. 12/04/18   Volanda Napoleon, PA-C  tadalafil (CIALIS) 10 MG tablet Take 5 mg by mouth daily. E.D.     [provider]  testosterone cypionate (DEPOTESTOSTERONE CYPIONATE) 200 MG/ML injection Inject 200 mg into the muscle every 14 (fourteen) days. 05/07/17   [provider]    Allergies    Patient has no known allergies.  Review of Systems   Review of Systems  Constitutional:  Negative for fever.  Respiratory:  Negative for shortness of breath.   Cardiovascular:  Positive for chest pain. Negative for leg swelling.  Gastrointestinal:  Negative for abdominal pain.  All other systems reviewed and are negative.  Physical Exam Updated Vital Signs BP (!) 142/85 (BP Location: Right Arm)   Pulse 72   Temp 98.2 F (36.8 C) (Oral)   Resp 16   Ht 5\' 10"  (1.778 m)   Wt 93.9 kg   SpO2 98%   BMI 29.70 kg/m   Physical Exam Vitals and nursing note reviewed.  Constitutional:      General: He is not in acute distress.    Appearance: He is well-developed. He is not ill-appearing or diaphoretic.  HENT:     Head: Normocephalic and atraumatic.     Right Ear: External ear normal.     Left Ear: External ear normal.     Nose: Nose normal.  Eyes:     General:        Right eye: No discharge.        Left eye: No discharge.  Cardiovascular:     Rate and Rhythm: Normal rate and regular rhythm.     Pulses:           Radial pulses are 2+ on the right side and 2+ on the left side.     Heart sounds: Normal heart sounds.  Pulmonary:     Effort: Pulmonary effort is normal.     Breath sounds: Normal breath sounds.  Abdominal:  Palpations: Abdomen is soft.     Tenderness: There is no abdominal tenderness.  Musculoskeletal:     Cervical back: Neck supple.  Skin:    General: Skin is warm.  Neurological:     Mental Status: He is alert.  Psychiatric:        Mood and Affect: Mood is not anxious.    ED Results / Procedures / Treatments   Labs (all labs ordered are listed, but only abnormal results are displayed) Labs Reviewed  BASIC METABOLIC PANEL - Abnormal; Notable for the following components:      Result Value   Glucose, Bld 119 (*)    All other components within normal limits  CBC WITH DIFFERENTIAL/PLATELET - Abnormal; Notable for the following components:   Abs Immature Granulocytes 0.13 (*)    All other components within normal limits  TROPONIN I (HIGH SENSITIVITY) - Abnormal; Notable for the following components:   Troponin I (High Sensitivity) 113 (*)    All other components within normal limits  RESP PANEL BY RT-PCR (FLU A&B, COVID) ARPGX2  TROPONIN I (HIGH SENSITIVITY)  TROPONIN I (HIGH SENSITIVITY)    EKG EKG Interpretation  Date/Time:  Sunday April 10 2021 12:42:32 EDT Ventricular Rate:  74 PR Interval:  198 QRS Duration: 96 QT Interval:  374 QTC Calculation: 415 R Axis:   84 Text Interpretation: Sinus rhythm ST elev, probable normal early repol pattern similar to earlier in the day Confirmed by Sherwood Gambler [phone]) on 04/10/2021 2:08:23 PM  Radiology DG Chest Portable 1 View  Result Date: 04/10/2021 CLINICAL DATA:  Chest pressure, sudden onset. EXAM: PORTABLE CHEST 1 VIEW COMPARISON:  Chest x-ray dated 06/08/2017. FINDINGS: Heart size and mediastinal contours are within normal limits. Lungs are clear. No pleural effusion or pneumothorax is seen. Osseous structures about  the chest are unremarkable. IMPRESSION: No active disease. No evidence of pneumonia or pulmonary edema. Electronically Signed   By: Franki Cabot M.D.   On: 04/10/2021 09:41    Procedures .Critical Care  Date/Time: 04/10/2021 2:18 PM Performed by: Sherwood Gambler, MD Authorized by: Sherwood Gambler, MD   Critical care provider statement:    Critical care time (minutes):  35   Critical care time was exclusive of:  Separately billable procedures and treating other patients   Critical care was necessary to treat or prevent imminent or life-threatening deterioration of the following conditions:  Cardiac failure and circulatory failure   Critical care was time spent personally by me on the following activities:  Discussions with consultants, evaluation of patient's response to treatment, examination of patient, ordering and performing treatments and interventions, ordering and review of laboratory studies, ordering and review of radiographic studies, pulse oximetry, re-evaluation of patient's condition, obtaining history from patient or surrogate and review of old charts   Medications Ordered in ED Medications  aspirin chewable tablet 324 mg (324 mg Oral Given 04/10/21 0945)  amLODipine (NORVASC) tablet 5 mg (5 mg Oral Given 04/10/21 1304)    ED Course  I have reviewed the triage vital signs and the nursing notes.  Pertinent labs & imaging results that were available during my care of the patient were reviewed by me and considered in my medical decision making (see chart for details).    MDM Rules/Calculators/A&P                          Patient has had multiple ECGs that all show diffuse ST elevations.  Could be early  repol or in the setting of a rising but mildly elevated troponin could be myopericarditis.  Discussed with Dr. Caryl Comes, who agrees this is not consistent with STEMI.  Recommends holding off on aspirin and will keep checking troponins but will admit to the cardiology service at [facility].  He was already given aspirin.  He has had waxing and waning pain in the emergency department.  However he is having minimal to no pain currently and appears stable for transfer for admission. Final Clinical Impression(s) / ED Diagnoses Final diagnoses:  Myopericarditis    Rx / DC Orders ED Discharge Orders     None        Sherwood Gambler, MD 04/10/21 1457

## 2021-04-10 NOTE — ED Notes (Signed)
ED Provider at bedside. To discuss admission and plan of care

## 2021-04-10 NOTE — ED Notes (Signed)
Troponin #3 obtained and to the lab

## 2021-04-10 NOTE — ED Triage Notes (Signed)
Chest pain onset within 1 hour, mid sternal pressure, diaphoresis per pt statement, radiation of pain to left arm. Chest pain pressure is a 5 out of 0-10 scale

## 2021-04-10 NOTE — ED Notes (Signed)
[facility] at bedside to transport pt 

## 2021-04-10 NOTE — ED Notes (Signed)
Covid Swab obtained and to the lab 

## 2021-04-10 NOTE — ED Notes (Signed)
ED Provider at bedside. 

## 2021-04-11 ENCOUNTER — Encounter (HOSPITAL_COMMUNITY): Payer: Self-pay | Admitting: Cardiovascular Disease

## 2021-04-11 ENCOUNTER — Encounter (HOSPITAL_COMMUNITY)
Admission: EM | Disposition: A | Discharge status: Home / Self Care | Payer: Self-pay | Source: Home / Self Care | Attending: Emergency Medicine

## 2021-04-11 ENCOUNTER — Observation Stay (HOSPITAL_BASED_OUTPATIENT_CLINIC_OR_DEPARTMENT_OTHER): Payer: Managed Care, Other (non HMO)

## 2021-04-11 ENCOUNTER — Other Ambulatory Visit (HOSPITAL_COMMUNITY): Payer: Self-pay

## 2021-04-11 DIAGNOSIS — I401 Isolated myocarditis: Secondary | ICD-10-CM

## 2021-04-11 DIAGNOSIS — R079 Chest pain, unspecified: Secondary | ICD-10-CM | POA: Diagnosis not present

## 2021-04-11 DIAGNOSIS — I2510 Atherosclerotic heart disease of native coronary artery without angina pectoris: Secondary | ICD-10-CM | POA: Diagnosis not present

## 2021-04-11 DIAGNOSIS — I214 Non-ST elevation (NSTEMI) myocardial infarction: Secondary | ICD-10-CM

## 2021-04-11 DIAGNOSIS — I514 Myocarditis, unspecified: Secondary | ICD-10-CM

## 2021-04-11 HISTORY — PX: LEFT HEART CATH AND CORONARY ANGIOGRAPHY: CATH118249

## 2021-04-11 LAB — CBC
HCT: 48.1 % (ref 39.0–52.0)
Hemoglobin: 16.2 g/dL (ref 13.0–17.0)
MCH: 30.1 pg (ref 26.0–34.0)
MCHC: 33.7 g/dL (ref 30.0–36.0)
MCV: 89.2 fL (ref 80.0–100.0)
Platelets: 299 10*3/uL (ref 150–400)
RBC: 5.39 MIL/uL (ref 4.22–5.81)
RDW: 13.1 % (ref 11.5–15.5)
WBC: 10.4 10*3/uL (ref 4.0–10.5)
nRBC: 0 % (ref 0.0–0.2)

## 2021-04-11 LAB — BASIC METABOLIC PANEL
Anion gap: 9 (ref 5–15)
BUN: 13 mg/dL (ref 6–20)
CO2: 26 mmol/L (ref 22–32)
Calcium: 9.1 mg/dL (ref 8.9–10.3)
Chloride: 100 mmol/L (ref 98–111)
Creatinine, Ser: 1.26 mg/dL — ABNORMAL HIGH (ref 0.61–1.24)
GFR, Estimated: >60 mL/min (ref >60)
Glucose, Bld: 102 mg/dL — ABNORMAL HIGH (ref 70–99)
Potassium: 4.8 mmol/L (ref 3.5–5.1)
Sodium: 135 mmol/L (ref 135–145)

## 2021-04-11 LAB — LIPID PANEL
Cholesterol: 173 mg/dL (ref 0–200)
HDL: 42 mg/dL (ref >40)
LDL Cholesterol: 85 mg/dL (ref 0–99)
Total CHOL/HDL Ratio: 4.1 RATIO
Triglycerides: 231 mg/dL — ABNORMAL HIGH (ref <150)
VLDL: 46 mg/dL — ABNORMAL HIGH (ref 0–40)

## 2021-04-11 LAB — ECHOCARDIOGRAM COMPLETE
Area-P 1/2: 2.42 cm2
Height: 71 in
S' Lateral: 2.6 cm
Weight: 3312.19 oz

## 2021-04-11 LAB — TROPONIN I (HIGH SENSITIVITY): Troponin I (High Sensitivity): 181 ng/L (ref <18)

## 2021-04-11 SURGERY — LEFT HEART CATH AND CORONARY ANGIOGRAPHY
Anesthesia: LOCAL

## 2021-04-11 MED ORDER — LIDOCAINE HCL (PF) 1 % IJ SOLN
INTRAMUSCULAR | Status: AC
  Filled 2021-04-11: qty 30

## 2021-04-11 MED ORDER — SODIUM CHLORIDE 0.9 % IV SOLN: 250.0000 mL | INTRAVENOUS | Status: DC | PRN

## 2021-04-11 MED ORDER — ATORVASTATIN CALCIUM 40 MG PO TABS
40.0000 mg | ORAL_TABLET | Freq: Every day | ORAL | 1 refills | Status: DC
  Filled 2021-04-11: qty 30, 30d supply, fill #0
  Filled 2021-05-10: qty 30, 30d supply, fill #1
  Filled 2021-06-08: qty 30, 30d supply, fill #2
  Filled 2021-07-19: qty 30, 30d supply, fill #3
  Filled 2021-08-31: qty 30, 30d supply, fill #4
  Filled 2021-10-08: qty 30, 30d supply, fill #5

## 2021-04-11 MED ORDER — HEPARIN SODIUM (PORCINE) 1000 UNIT/ML IJ SOLN
INTRAMUSCULAR | Status: DC | PRN
  Administered 2021-04-11: 5000 [IU] via INTRAVENOUS

## 2021-04-11 MED ORDER — VERAPAMIL HCL 2.5 MG/ML IV SOLN
INTRAVENOUS | Status: DC | PRN
  Administered 2021-04-11: 10 mL via INTRA_ARTERIAL

## 2021-04-11 MED ORDER — HYDRALAZINE HCL 20 MG/ML IJ SOLN: 10.0000 mg | INTRAMUSCULAR | Status: DC | PRN

## 2021-04-11 MED ORDER — HEPARIN BOLUS VIA INFUSION
4000.0000 [IU] | Freq: Once | INTRAVENOUS | Status: AC
  Administered 2021-04-11: 4000 [IU] via INTRAVENOUS
  Filled 2021-04-11: qty 4000

## 2021-04-11 MED ORDER — SODIUM CHLORIDE 0.9 % IV SOLN: INTRAVENOUS | Status: DC

## 2021-04-11 MED ORDER — SODIUM CHLORIDE 0.9 % WEIGHT BASED INFUSION
1.0000 mL/kg/h | INTRAVENOUS | Status: DC
  Administered 2021-04-11: 1 mL/kg/h via INTRAVENOUS

## 2021-04-11 MED ORDER — COLCHICINE 0.6 MG PO TABS
0.6000 mg | ORAL_TABLET | Freq: Two times a day (BID) | ORAL | 0 refills | Status: DC
  Filled 2021-04-11: qty 60, 30d supply, fill #0
  Filled 2021-05-10: qty 60, 30d supply, fill #1
  Filled 2021-06-08: qty 60, 30d supply, fill #2

## 2021-04-11 MED ORDER — HEPARIN (PORCINE) 25000 UT/250ML-% IV SOLN
1200.0000 [IU]/h | INTRAVENOUS | Status: DC
  Administered 2021-04-11: 1200 [IU]/h via INTRAVENOUS
  Filled 2021-04-11: qty 250

## 2021-04-11 MED ORDER — SODIUM CHLORIDE 0.9 % WEIGHT BASED INFUSION
3.0000 mL/kg/h | INTRAVENOUS | Status: DC
  Administered 2021-04-11: 3 mL/kg/h via INTRAVENOUS

## 2021-04-11 MED ORDER — NITROGLYCERIN 0.4 MG SL SUBL
0.4000 mg | SUBLINGUAL_TABLET | SUBLINGUAL | 1 refills | Status: DC | PRN
  Filled 2021-04-11: qty 25, 8d supply, fill #0

## 2021-04-11 MED ORDER — SODIUM CHLORIDE 0.9% FLUSH: 3.0000 mL | INTRAVENOUS | Status: DC | PRN

## 2021-04-11 MED ORDER — PNEUMOCOCCAL VAC POLYVALENT 25 MCG/0.5ML IJ INJ: 0.5000 mL | INJECTION | INTRAMUSCULAR | Status: DC

## 2021-04-11 MED ORDER — COLCHICINE 0.6 MG PO TABS
0.6000 mg | ORAL_TABLET | Freq: Two times a day (BID) | ORAL | Status: DC
  Administered 2021-04-11: 0.6 mg via ORAL
  Filled 2021-04-11: qty 1

## 2021-04-11 MED ORDER — IBUPROFEN 800 MG PO TABS
800.0000 mg | ORAL_TABLET | Freq: Three times a day (TID) | ORAL | 0 refills | Status: AC
  Filled 2021-04-11: qty 21, 7d supply, fill #0

## 2021-04-11 MED ORDER — IBUPROFEN 600 MG PO TABS
800.0000 mg | ORAL_TABLET | Freq: Three times a day (TID) | ORAL | Status: DC
  Administered 2021-04-11: 800 mg via ORAL
  Filled 2021-04-11: qty 1

## 2021-04-11 MED ORDER — METOPROLOL SUCCINATE ER 50 MG PO TB24: 50.0000 mg | ORAL_TABLET | Freq: Every day | ORAL | Status: DC

## 2021-04-11 MED ORDER — FENTANYL CITRATE (PF) 100 MCG/2ML IJ SOLN
INTRAMUSCULAR | Status: AC
  Filled 2021-04-11: qty 2

## 2021-04-11 MED ORDER — ASPIRIN 81 MG PO TBEC
81.0000 mg | DELAYED_RELEASE_TABLET | Freq: Every day | ORAL | 1 refills | Status: DC
  Filled 2021-04-11: qty 30, 30d supply, fill #0
  Filled 2021-05-10: qty 30, 30d supply, fill #1

## 2021-04-11 MED ORDER — MIDAZOLAM HCL 2 MG/2ML IJ SOLN
INTRAMUSCULAR | Status: AC
  Filled 2021-04-11: qty 2

## 2021-04-11 MED ORDER — HEPARIN (PORCINE) IN NACL 1000-0.9 UT/500ML-% IV SOLN
INTRAVENOUS | Status: AC
  Filled 2021-04-11: qty 1000

## 2021-04-11 MED ORDER — ASPIRIN 81 MG PO CHEW
81.0000 mg | CHEWABLE_TABLET | ORAL | Status: AC
  Administered 2021-04-11: 81 mg via ORAL
  Filled 2021-04-11: qty 1

## 2021-04-11 MED ORDER — SODIUM CHLORIDE 0.9% FLUSH: 3.0000 mL | Freq: Two times a day (BID) | INTRAVENOUS | Status: DC

## 2021-04-11 MED ORDER — HEPARIN SODIUM (PORCINE) 1000 UNIT/ML IJ SOLN
INTRAMUSCULAR | Status: AC
  Filled 2021-04-11: qty 1

## 2021-04-11 MED ORDER — HEPARIN (PORCINE) IN NACL 1000-0.9 UT/500ML-% IV SOLN
INTRAVENOUS | Status: DC | PRN
  Administered 2021-04-11 (×2): 500 mL

## 2021-04-11 MED ORDER — MIDAZOLAM HCL 2 MG/2ML IJ SOLN
INTRAMUSCULAR | Status: DC | PRN
  Administered 2021-04-11: 1 mg via INTRAVENOUS
  Administered 2021-04-11: 2 mg via INTRAVENOUS

## 2021-04-11 MED ORDER — LABETALOL HCL 5 MG/ML IV SOLN: 10.0000 mg | INTRAVENOUS | Status: DC | PRN

## 2021-04-11 MED ORDER — FENTANYL CITRATE (PF) 100 MCG/2ML IJ SOLN
INTRAMUSCULAR | Status: DC | PRN
  Administered 2021-04-11: 50 ug via INTRAVENOUS
  Administered 2021-04-11 (×2): 25 ug via INTRAVENOUS

## 2021-04-11 MED ORDER — METOPROLOL SUCCINATE ER 50 MG PO TB24
50.0000 mg | ORAL_TABLET | Freq: Every day | ORAL | 1 refills | Status: DC
  Filled 2021-04-11: qty 30, 30d supply, fill #0
  Filled 2021-05-10: qty 30, 30d supply, fill #1

## 2021-04-11 MED ORDER — VERAPAMIL HCL 2.5 MG/ML IV SOLN
INTRAVENOUS | Status: AC
  Filled 2021-04-11: qty 2

## 2021-04-11 MED ORDER — LIDOCAINE HCL (PF) 1 % IJ SOLN
INTRAMUSCULAR | Status: DC | PRN
  Administered 2021-04-11: 2 mL via INTRADERMAL

## 2021-04-11 SURGICAL SUPPLY — 11 items
CATH 5FR JL3.5 JR4 ANG PIG MP (CATHETERS) ×1 IMPLANT
CATH VISTA GUIDE 6FR XB3.5 (CATHETERS) ×1 IMPLANT
DEVICE RAD COMP TR BAND LRG (VASCULAR PRODUCTS) ×1 IMPLANT
GLIDESHEATH SLEND SS 6F .021 (SHEATH) ×1 IMPLANT
KIT HEART LEFT (KITS) ×2 IMPLANT
PACK CARDIAC CATHETERIZATION (CUSTOM PROCEDURE TRAY) ×2 IMPLANT
SYR MEDRAD MARK 7 150ML (SYRINGE) ×2 IMPLANT
TRANSDUCER W/STOPCOCK (MISCELLANEOUS) ×2 IMPLANT
TUBING CIL FLEX 10 FLL-RA (TUBING) ×2 IMPLANT
WIRE EMERALD 3MM-J .035X150CM (WIRE) ×1 IMPLANT
WIRE HI TORQ VERSACORE J 260CM (WIRE) ×1 IMPLANT

## 2021-04-11 NOTE — Progress Notes (Signed)
ANTICOAGULATION CONSULT NOTE - Initial Consult  Pharmacy Consult for Heparin Indication: chest pain/ACS  No Known Allergies  Patient Measurements: Height: 5\' 11"  (180.3 cm) Weight: 93.9 kg (207 lb 0.2 oz) (210.0 pounds) IBW/kg (Calculated) : 75.3    Vital Signs: Temp: 98.6 F (37 C) (07/11 0855) Temp Source: Oral (07/11 0855) BP: 136/97 (07/11 0927) Pulse Rate: 71 (07/11 0927)  Labs: Recent Labs    04/10/21 0921 04/10/21 1136 04/10/21 1344 04/10/21 1641 04/10/21 1732 04/11/21 0241 04/11/21 0544  HGB 16.8  --   --   --  16.3 16.2  --   HCT 49.3  --   --   --  49.6 48.1  --   PLT 317  --   --   --  297 299  --   CREATININE 1.19  --   --   --  1.10 1.26*  --   TROPONINIHS 3   < > 253* 445*  --   --  181*   < > = values in this interval not displayed.    Estimated Creatinine Clearance: 81.1 mL/min (A) (by C-G formula based on SCr of 1.26 mg/dL (H)).   Medical History: Past Medical History:  Diagnosis Date   Cancer of appendix ([place])    Crohn disease ([place])    Dyslipidemia    GERD (gastroesophageal reflux disease)    Herpes simplex    Hypertension    Non Hodgkin's lymphoma ([place])    Peripheral vascular disease ([place])    Left hand ischemia, Left hand small vascular disease, Left ulnar aneurysm with distal occlusion   Raynaud's disease       Assessment: 50yom admitted with chest pain no ECG changes trop elevated 400 plan cath   Goal of Therapy:  Heparin level 0.3-0.7 units/ml Monitor platelets by anticoagulation protocol: Yes   Plan:  Heparin bolus 4000 utsIV x1  Heparin drip 1200 uts/hr  Heparin level 6hr after start Daily heparin level and CBC daily  Monitor s/s bleeding    Bonnita Nasuti Pharm.D. CPP, BCPS Clinical Pharmacist [phone] 04/11/2021 10:39 AM

## 2021-04-11 NOTE — Discharge Summary (Signed)
Discharge Summary    Patient ID: Gilbert Pierce MRN: [id number]; DOB: 1970-10-18  Admit date: 04/10/2021 Discharge date: 04/11/2021  PCP:  Wenda Low, MD   [facility] Providers Cardiologist:  New to [facility], wants to follow up with Dr Jenetta DownerNori Riis   Discharge Diagnoses    Principal Problem:   Myocarditis [facility]) Active Problems:   Elevated troponin   Unstable angina ([facility])   Non-ST elevation (NSTEMI) myocardial infarction [facility])   Chest pain at rest    Diagnostic Studies/Procedures    Left heat cath on 04/11/21:  Ost LM to Mid LM lesion is 30% stenosed. Mid LAD lesion is 20% stenosed. Dist LAD lesion is 30% stenosed.   Mild non-obstructive CAD Normal LV filling pressures   Recommendations: Medical management of mild CAD. Consider cardiac MRI for evaluation of possible myopericarditis.     _____________ Echo from 04/11/21:   1. Left ventricular ejection fraction, by estimation, is 55 to 60%. The  left ventricle has normal function. The left ventricle has no regional  wall motion abnormalities. There is mild asymmetric left ventricular  hypertrophy of the basal-septal segment.  Left ventricular diastolic parameters are consistent with Grade I  diastolic dysfunction (impaired relaxation).   2. Right ventricular systolic function is normal. The right ventricular  size is normal. There is normal pulmonary artery systolic pressure.   3. The mitral valve is normal in structure. Trivial mitral valve  regurgitation.   4. The aortic valve is tricuspid. There is mild calcification of the  aortic valve. There is mild thickening of the aortic valve. Aortic valve  regurgitation is not visualized.   5. The inferior vena cava is normal in size with greater than 50%  respiratory variability, suggesting right atrial pressure of 3 mmHg.   Comparison(s): Compared to prior echo report from [facility] in 2016, there  is no significant change.    History of Present Illness     Gilbert Pierce is a 50 y.o. male with PMH of Crohn's disease, HTN, HLD, hx of ulnar artery aneurysm requiring surgery, artery insufficiency of left hand finger, raynaud's disease, cancer of appendix, who presented to ER on 04/10/21 for chest pain.   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  He was in his usual state of health until 04/10/21. He developed chest pain that morning, rate 5/10, tried to belch without improvement. He last took Cialis 04/10/21. Pain come and goes, pain free at ED. Echo from 2016 with EF 56% mild concentric LVH, no significant valve issues. EKG at admission showed SR with early repol vs ST elevation.  No acute changes from EKG in 2018. Hs trop was 3 >113 >253. Cr 1.19. CBC diff unremarkable. COVID -19 negative. CXR showed no acute disease.   He was concerned for unstable angina versus myocarditis, subsequently admitted to cardiology service.     Hospital Course     Consultants: N/A   Non-obstructive CAD NSTEMI  - presented with chest pain - Hs Trop 3 >113 >253 >445 >181  - EKG without acute ischemic findings - s/p LHC on 04/11/21 showed 30 stenosis of ost LM to mid LM,  20% stenosis of mid LAD, and 30% stenosis of dist LAD; non-obstructive CAD with normal LV filing pressures  - LDL 85 and A1C 5.7%, discussed lifestyle modification, started on lipitor57m  daily admission for HLD - continue medical therapy with ASA 25m daily and lisinopril 213mdaily, started on metoprolol this admission, will discharge on 5065metoprolol XL  - radial site care discussed, follow up with Dr O'NAudie Box 2 weeks, office will call patient  - will provide PRN nitro script, informed patient to avoid concurrent use of Cialis   Myocarditis  - CRP elevated 1.5 , ESR WNL - clinically suspect myocarditis , will treat with 7 days  course of ibuprofen 800m57mD and 3 month course of colchicine 0.6mg 15m, already on PPI historically - advised patient to stop PRN Celebrex while taking ibuprofen - consider repeat BMP at follow up appointment while on colchicine  - follow up will be arranged with Dr O'NeaAudie Box weeks    HTN - on amlodipine 5mg a46mlisinopril 20mg d58m at home and continued, add metoprolol 50mg XL65mP fairly controlled   HLD  - LDL 85, started on lipitor 40mg dai24mere, will continue, repeat LFT in 1 month and lipid panel in 3 month, discussed lifestyle modification   Pre-diabetes - A1C 5.7% on 04/10/21 - discussed lifestyle modification, diet, exercise - repeat A1C in 3 month per PCP   Crohn's disease - resume home meds adalimumab, [facility]   [facility], can continue full course per previous instruction  Other chronic medical condition - not addressed this admission, follow up with PCP and GI as scheduled    Did the patient have an acute coronary syndrome (MI, NSTEMI, STEMI, etc) this admission?:  Yes                               AHA/ACC Clinical Performance & Quality Measures: Aspirin prescribed? - Yes ADP Receptor Inhibitor (Plavix/Clopidogrel, Brilinta/Ticagrelor or Effient/Prasugrel) prescribed (includes medically managed patients)? - No - non-obstructive CAD  Beta Blocker prescribed? - Yes High Intensity Statin (Lipitor 40-80mg or C19mor 20-40mg) pres36med? - Yes EF assessed during THIS hospitalization? - Yes For EF <40%, was ACEI/ARB prescribed? - Yes For EF <40%, Aldosterone Antagonist (Spironolactone or Eplerenone) prescribed? - Not Applicable (EF >/= 40%) Cardia94%ehab Phase II ordered (including medically managed patients)? - Yes      _____________  Discharge Vitals Blood pressure 131/90, pulse 73, temperature 98.1 F (36.7 C), temperature source Oral, resp. rate 17, height '5\' 11"'  (1.803 m), weight 93.9 kg, SpO2 97 %.  Filed  Weights   04/10/21 0915 04/10/21 1646 04/10/21 1700  Weight: 93.9 kg 93.9 kg 93.9 kg    Labs & Radiologic Studies    CBC Recent Labs    04/10/21 0921 04/10/21 1732 04/11/21 0241  WBC 9.6 9.2 10.4  NEUTROABS 4.4  --   --   HGB 16.8 16.3 16.2  HCT 49.3 49.6 48.1  MCV 89.3 88.7 89.2  PLT 317 297 299   Basic801tabolic Panel Recent Labs    04/10/21 0921 04/10/21 1732 04/11/21 0241  NA 136  --  135  K 4.6  --  4.8  CL 101  --  100  CO2 27  --  26  GLUCOSE 119*  --  102*  BUN 16  --  13  CREATININE 1.19 1.10 1.26*  CALCIUM 9.1  --  9.1  MG  --  2.0  --    Liver Function Tests Recent Labs    04/10/21 1732  AST 35  ALT 55*  ALKPHOS 54  BILITOT 1.1  PROT 7.3  ALBUMIN 3.6   No results for input(s): LIPASE, AMYLASE in the last 72 hours. High Sensitivity Troponin:   Recent Labs  Lab 04/10/21 0921 04/10/21 1136 04/10/21 1344 04/10/21 1641 04/11/21 0544  TROPONINIHS 3 113* 253* 445* 181*    BNP Invalid input(s): POCBNP D-Dimer No results for input(s): DDIMER in the last 72 hours. Hemoglobin A1C Recent Labs    04/10/21 1732  HGBA1C 5.7*   Fasting Lipid Panel Recent Labs    04/11/21 0241  CHOL 173  HDL 42  LDLCALC 85  TRIG 231*  CHOLHDL 4.1   Thyroid Function Tests Recent Labs    04/10/21 1732  TSH 1.650   _____________  CARDIAC CATHETERIZATION  Result Date: 04/11/2021  Ost LM to Mid LM lesion is 30% stenosed.  Mid LAD lesion is 20% stenosed.  Dist LAD lesion is 30% stenosed.  Mild non-obstructive CAD Normal LV filling pressures Recommendations: Medical management of mild CAD. Consider cardiac MRI for evaluation of possible myopericarditis.   DG Chest Portable 1 View  Result Date: 04/10/2021 CLINICAL DATA:  Chest pressure, sudden onset. EXAM: PORTABLE CHEST 1 VIEW COMPARISON:  Chest x-ray dated 06/08/2017. FINDINGS: Heart size and mediastinal contours are within normal limits. Lungs are clear. No pleural effusion or pneumothorax is seen.  Osseous structures about the chest are unremarkable. IMPRESSION: No active disease. No evidence of pneumonia or pulmonary edema. Electronically Signed   By: Franki Cabot M.D.   On: 04/10/2021 09:41   ECHOCARDIOGRAM COMPLETE  Result Date: 04/11/2021    ECHOCARDIOGRAM REPORT   Patient Name:   Gilbert Pierce Date of Exam: 04/11/2021 Medical Rec #:  [id number]       Height:       71.0 in Accession #:    [id number]      Weight:       207.0 lb Date of Birth:  May 30, 1971       BSA:          2.140 m Patient Age:    33 years        BP:           132/84 mmHg Patient Gender: M               HR:           71 bpm. Exam Location:  Inpatient Procedure: 2D Echo, 3D Echo, Color Doppler, Cardiac Doppler and Strain Analysis Indications:    R07.9* Chest pain, unspecified  History:        Patient has prior history of Echocardiogram examinations, most                 recent 02/26/2015. Risk Factors:Hypertension and Dyslipidemia.                 Prior performed at [place]:    Raquel Sarna Senior RDCS Referring Phys: [place]  1. Left ventricular ejection fraction, by estimation, is 55 to 60%. The left ventricle has normal function. The left ventricle has no regional wall motion abnormalities. There is mild asymmetric left ventricular hypertrophy of the basal-septal segment. Left ventricular diastolic parameters are consistent with Grade I diastolic dysfunction (impaired relaxation).  2. Right ventricular systolic function is normal. The right ventricular size is normal. There is normal pulmonary artery systolic pressure.  3. The mitral valve is normal in structure. Trivial mitral valve regurgitation.  4. The aortic valve is tricuspid. There is mild calcification  of the aortic valve. There is mild thickening of the aortic valve. Aortic valve regurgitation is not visualized.  5. The inferior vena cava is normal in size with greater than 50% respiratory variability, suggesting right atrial pressure of 3 mmHg.  Comparison(s): Compared to prior echo report from [facility] in 2016, there is no significant change. FINDINGS  Left Ventricle: Left ventricular ejection fraction, by estimation, is 55 to 60%. The left ventricle has normal function. The left ventricle has no regional wall motion abnormalities. Global longitudinal strain performed but not reported based on interpreter judgement due to suboptimal tracking. 3D left ventricular ejection fraction analysis performed but not reported based on interpreter judgement due to suboptimal quality. The left ventricular internal cavity size was normal in size. There is mild asymmetric left ventricular hypertrophy of the basal-septal segment. Left ventricular diastolic parameters are consistent with Grade I diastolic dysfunction (impaired relaxation). Normal left ventricular filling pressure. Right Ventricle: The right ventricular size is normal. No increase in right ventricular wall thickness. Right ventricular systolic function is normal. There is normal pulmonary artery systolic pressure. The tricuspid regurgitant velocity is 1.85 m/s, and  with an assumed right atrial pressure of 3 mmHg, the estimated right ventricular systolic pressure is 79.8 mmHg. Left Atrium: Left atrial size was normal in size. Right Atrium: Right atrial size was normal in size. Pericardium: There is no evidence of pericardial effusion. Mitral Valve: The mitral valve is normal in structure. There is mild thickening of the mitral valve leaflet(s). There is mild calcification of the mitral valve leaflet(s). Mild mitral annular calcification. Trivial mitral valve regurgitation. Tricuspid Valve: The tricuspid valve is normal in structure. Tricuspid valve regurgitation is trivial. Aortic Valve: The aortic valve is tricuspid. There is mild calcification of the aortic valve. There is mild thickening of the aortic valve. Aortic valve regurgitation is not visualized. Pulmonic Valve: The pulmonic valve was normal in  structure. Pulmonic valve regurgitation is trivial. Aorta: The aortic root is normal in size and structure. Venous: The inferior vena cava is normal in size with greater than 50% respiratory variability, suggesting right atrial pressure of 3 mmHg. IAS/Shunts: No atrial level shunt detected by color flow Doppler.  LEFT VENTRICLE PLAX 2D LVIDd:         4.00 cm  Diastology LVIDs:         2.60 cm  LV e' medial:    7.72 cm/s LV PW:         1.00 cm  LV E/e' medial:  6.8 LV IVS:        1.00 cm  LV e' lateral:   11.40 cm/s LVOT diam:     2.20 cm  LV E/e' lateral: 4.6 LV SV:         71 LV SV Index:   33 LVOT Area:     3.80 cm                          3D Volume EF:                         3D EF:        53 %                         LV EDV:       97 ml  LV ESV:       45 ml                         LV SV:        52 ml RIGHT VENTRICLE RV S prime:     9.14 cm/s TAPSE (M-mode): 2.1 cm LEFT ATRIUM             Index       RIGHT ATRIUM           Index LA diam:        4.10 cm 1.92 cm/m  RA Area:     11.30 cm LA Vol (A2C):   54.5 ml 25.47 ml/m RA Volume:   20.90 ml  9.77 ml/m LA Vol (A4C):   27.4 ml 12.81 ml/m LA Biplane Vol: 39.8 ml 18.60 ml/m  AORTIC VALVE LVOT Vmax:   90.20 cm/s LVOT Vmean:  66.900 cm/s LVOT VTI:    0.188 m  AORTA Ao Root diam: 3.50 cm MITRAL VALVE               TRICUSPID VALVE MV Area (PHT): 2.42 cm    TR Peak grad:   13.7 mmHg MV Decel Time: 314 msec    TR Vmax:        185.00 cm/s MV E velocity: 52.40 cm/s MV A velocity: 57.60 cm/s  SHUNTS MV E/A ratio:  0.91        Systemic VTI:  0.19 m                            Systemic Diam: 2.20 cm Gwyndolyn Kaufman MD Electronically signed by Gwyndolyn Kaufman MD Signature Date/Time: 04/11/2021/11:28:28 AM    Final    Disposition   Patient was exmained by Dr Audie Box previously on rounding, deemed stable for discharge. He is examined post- cath, right hand warm to touch, no radial site bleeding, small bruise noted. Pt is being discharged home  today in good condition.  Follow-up Plans & Appointments     Follow-up Information     O'Neal, Cassie Freer, MD Follow up.   Specialties: Cardiology, Internal Medicine, Radiology Why: Office will call you in 1-2 days arrange follow up appointment Contact information: [place] [address] [phone]               Follow up with Dr Jenetta DownerNori Riis in 1-2 weeks, office will call patient for appt.  Discharge Instructions     Diet - low sodium heart healthy   Complete by: As directed    Diet Carb Modified   Complete by: As directed    Increase activity slowly   Complete by: As directed        Discharge Medications   Allergies as of 04/11/2021   No Known Allergies      Medication List     STOP taking these medications    celecoxib 200 MG capsule Commonly known as: CELEBREX   famotidine 20 MG tablet Commonly known as: PEPCID   sucralfate 1 GM/10ML suspension Commonly known as: Carafate       TAKE these medications    acetaminophen 500 MG tablet Commonly known as: TYLENOL Take 1,000 mg by mouth every 6 (six) hours as needed for moderate pain.   Adalimumab 40 MG/0.8ML Pskt Inject 40 mg into the skin every 14 (fourteen) days.   amLODipine 5 MG tablet Commonly known as: NORVASC  Take 5 mg by mouth daily.   amoxicillin 875 MG tablet Commonly known as: AMOXIL Take 875 mg by mouth 2 (two) times daily. Start date : 04/07/21   aspirin 81 MG EC tablet Take 1 tablet (81 mg total) by mouth daily. Swallow whole. Start taking on: April 12, 2021   atorvastatin 40 MG tablet Commonly known as: LIPITOR Take 1 tablet (40 mg total) by mouth daily. Start taking on: April 12, 2021   cholecalciferol 25 MCG (1000 UNIT) tablet Commonly known as: VITAMIN D3 Take 2,000 Units by mouth daily.   cholestyramine 4 g packet Commonly known as: QUESTRAN Take 4 g by mouth at bedtime.   colchicine 0.6 MG tablet Take 1 tablet (0.6 mg total) by mouth 2 (two)  times daily for 7 days.   cyanocobalamin 1000 MCG/ML injection Commonly known as: (VITAMIN B-12) Inject 1,000 mcg into the skin every 21 ( twenty-one) days.   Fish Oil 1200 MG Caps Take 1,200 mg by mouth daily.   ibuprofen 800 MG tablet Commonly known as: ADVIL Take 1 tablet (800 mg total) by mouth 3 (three) times daily for 7 days. What changed:  when to take this reasons to take this   lisinopril 20 MG tablet Commonly known as: ZESTRIL Take 20 mg by mouth daily.   magnesium 30 MG tablet Take 30 mg by mouth daily.   metoprolol succinate 50 MG 24 hr tablet Commonly known as: TOPROL-XL Take 1 tablet (50 mg total) by mouth daily. Take with or immediately following a meal. Start taking on: April 12, 2021   nitroGLYCERIN 0.4 MG SL tablet Commonly known as: NITROSTAT Place 1 tablet (0.4 mg total) under the tongue every 5 (five) minutes x 3 doses as needed for chest pain.   omeprazole 40 MG capsule Commonly known as: PRILOSEC Take 40 mg by mouth daily.   OVER THE COUNTER MEDICATION Take 500 mg by mouth daily. Ginko Biloba   phentermine 30 MG capsule Take 30 mg by mouth daily as needed (depression).   tadalafil 5 MG tablet Commonly known as: CIALIS Take 5 mg by mouth daily as needed for erectile dysfunction.   testosterone cypionate 200 MG/ML injection Commonly known as: DEPOTESTOSTERONE CYPIONATE Inject 200 mg into the muscle every 14 (fourteen) days.           Outstanding Labs/Studies    Duration of Discharge Encounter   Greater than 30 minutes including physician time.  Signed, Margie Billet, NP 04/11/2021, 3:12 PM

## 2021-04-11 NOTE — Progress Notes (Signed)
Nursing staff unable to arrange for cardiac rehab team to see pateint before that team left fopr the day.  Contacted Cherlynn Polo PA with cardiology to determien if pateint would need to stay night to see cardiac rehab in morning prior to discharge. Daleen Snook was supportive of allowing patient to discharge today without seeing cardiac rehab.  Richardean Canal RN, BSN, CCRN-K

## 2021-04-11 NOTE — H&P (View-Only) (Signed)
Cardiology Progress Note  Patient ID: Gilbert Pierce MRN: [id number] DOB: May 20, 1971 Date of Encounter: 04/11/2021  Primary Cardiologist: None  Subjective   Chief Complaint: Chest pain  HPI: Describes intermittent chest pressure since yesterday.  No further episodes.  Cardiology consult team did not start heparin.  Was treated for recent URI.  Concern for myocarditis.  EKG without ischemic changes.  No positional changes to suggest pericarditis.  Family history of CAD.  NPO.  ROS:  All other ROS reviewed and negative. Pertinent positives noted in the HPI.     Inpatient Medications  Scheduled Meds:  amLODipine  5 mg Oral Daily   amoxicillin  500 mg Oral Q8H   aspirin EC  81 mg Oral Daily   atorvastatin  40 mg Oral Daily   cholestyramine  4 g Oral QHS   heparin  5,000 Units Subcutaneous Q8H   lisinopril  20 mg Oral Daily   metoprolol tartrate  12.5 mg Oral BID   metoprolol tartrate  25 mg Oral BID   metoprolol tartrate  50 mg Oral Once   pantoprazole  40 mg Oral Daily   sodium chloride flush  3 mL Intravenous Q12H   Continuous Infusions:  sodium chloride     PRN Meds: sodium chloride, acetaminophen, nitroGLYCERIN, ondansetron (ZOFRAN) IV, sodium chloride flush, zolpidem   Vital Signs   Vitals:   04/10/21 1946 04/10/21 2013 04/10/21 2021 04/11/21 0500  BP: (!) 151/99 (!) 146/94 140/90 132/84  Pulse: 72 66 71 69  Resp: 18   18  Temp: 98.2 F (36.8 C)   98.2 F (36.8 C)  TempSrc: Oral   Oral  SpO2: 97% 95% 96% 98%  Weight:      Height:       No intake or output data in the 24 hours ending 04/11/21 0728 Last 3 Weights 04/10/2021 04/10/2021 04/10/2021  Weight (lbs) 207 lb 0.2 oz 207 lb 0.2 oz 207 lb  Weight (kg) 93.9 kg 93.9 kg 93.895 kg      Telemetry  Overnight telemetry shows sinus rhythm in the 60s, which I personally reviewed.   ECG  The most recent ECG shows sinus rhythm heart rate 69, no acute ischemic changes or evidence of infarction, which I personally  reviewed.   Physical Exam   Vitals:   04/10/21 1946 04/10/21 2013 04/10/21 2021 04/11/21 0500  BP: (!) 151/99 (!) 146/94 140/90 132/84  Pulse: 72 66 71 69  Resp: 18   18  Temp: 98.2 F (36.8 C)   98.2 F (36.8 C)  TempSrc: Oral   Oral  SpO2: 97% 95% 96% 98%  Weight:      Height:       No intake or output data in the 24 hours ending 04/11/21 0728  Last 3 Weights 04/10/2021 04/10/2021 04/10/2021  Weight (lbs) 207 lb 0.2 oz 207 lb 0.2 oz 207 lb  Weight (kg) 93.9 kg 93.9 kg 93.895 kg    Body mass index is 28.87 kg/m.  General: Well nourished, well developed, in no acute distress Head: Atraumatic, normal size  Eyes: PEERLA, EOMI  Neck: Supple, no JVD Endocrine: No thryomegaly Cardiac: Normal S1, S2; RRR; no murmurs, rubs, or gallops Lungs: Clear to auscultation bilaterally, no wheezing, rhonchi or rales  Abd: Soft, nontender, no hepatomegaly  Ext: No edema, pulses 2+ Musculoskeletal: No deformities, BUE and BLE strength normal and equal Skin: Warm and dry, no rashes   Neuro: Alert and oriented to person, place, time, and situation, CNII-XII  grossly intact, no focal deficits  Psych: Normal mood and affect   Labs  High Sensitivity Troponin:   Recent Labs  Lab 04/10/21 0921 04/10/21 1136 04/10/21 1344 04/10/21 1641 04/11/21 0544  TROPONINIHS 3 113* 253* 445* 181*     Cardiac EnzymesNo results for input(s): TROPONINI in the last 168 hours. No results for input(s): TROPIPOC in the last 168 hours.  Chemistry Recent Labs  Lab 04/10/21 0921 04/10/21 1732 04/11/21 0241  NA 136  --  135  K 4.6  --  4.8  CL 101  --  100  CO2 27  --  26  GLUCOSE 119*  --  102*  BUN 16  --  13  CREATININE 1.19 1.10 1.26*  CALCIUM 9.1  --  9.1  PROT  --  7.3  --   ALBUMIN  --  3.6  --   AST  --  35  --   ALT  --  55*  --   ALKPHOS  --  54  --   BILITOT  --  1.1  --   GFRNONAA >60 >60 >60  ANIONGAP 8  --  9    Hematology Recent Labs  Lab 04/10/21 0921 04/10/21 1732 04/11/21 0241   WBC 9.6 9.2 10.4  RBC 5.52 5.59 5.39  HGB 16.8 16.3 16.2  HCT 49.3 49.6 48.1  MCV 89.3 88.7 89.2  MCH 30.4 29.2 30.1  MCHC 34.1 32.9 33.7  RDW 13.2 13.0 13.1  PLT 317 297 299   BNPNo results for input(s): BNP, PROBNP in the last 168 hours.  DDimer No results for input(s): DDIMER in the last 168 hours.   Radiology  DG Chest Portable 1 View  Result Date: 04/10/2021 CLINICAL DATA:  Chest pressure, sudden onset. EXAM: PORTABLE CHEST 1 VIEW COMPARISON:  Chest x-ray dated 06/08/2017. FINDINGS: Heart size and mediastinal contours are within normal limits. Lungs are clear. No pleural effusion or pneumothorax is seen. Osseous structures about the chest are unremarkable. IMPRESSION: No active disease. No evidence of pneumonia or pulmonary edema. Electronically Signed   By: Franki Cabot M.D.   On: 04/10/2021 09:41    Cardiac Studies  Echo pending  Patient Profile  Gilbert Pierce is a 50 y.o. male with Crohn's disease, GERD, hypertension, PAD (occluded L ulnar artery 2016) who was admitted on 04/10/2021 with chest pain and elevated troponin concerning for non-STEMI.  Assessment & Plan   1.  Non-STEMI -He presents with intermittent chest pressure.  Symptoms are nonpositional.  Cardiovascular examination is normal.  EKG unremarkable. -Strong family history of heart disease. -Recent URI.  Unclear if this is related. -Symptoms are concerning for unstable angina.  Would recommend to proceed with left heart catheterization.  I discussed with him that coronary CTA may not be the best test given his elevated troponins.  Troponins are minimally elevated and flat.  Concerns for possible myocarditis.  CRP marginally elevated.  ESR normal.  No concerning for pericarditis based on symptoms.  Would recommend to proceed with left heart catheterization today.  Risk and benefits explained.  He is willing to proceed. -On aspirin.  Continue high intensity statin.  BP well controlled.  On a beta-blocker.  Echo is  pending. -Further management pending left heart catheterization.  If normal coronaries will need cardiac MRI.  2.  Hypertension -Home BP medications  3.  Crohn's disease -No change to her medications  Shared Decision Making/Informed Consent The risks [stroke (1 in 1000), death (1 in 65), kidney  failure [usually temporary] (1 in 500), bleeding (1 in 200), allergic reaction [possibly serious] (1 in 200)], benefits (diagnostic support and management of coronary artery disease) and alternatives of a cardiac catheterization were discussed in detail with Gilbert Pierce and he is willing to proceed.   FEN -Precath fluids -DVT Ppx: Heparin drip -Diet: N.p.o. -Code: Full  For questions or updates, please contact [place] Please consult [email or web address] for contact info under   Time Spent with Patient: I have spent a total of 35 minutes with patient reviewing hospital notes, telemetry, EKGs, labs and examining the patient as well as establishing an assessment and plan that was discussed with the patient.  > 50% of time was spent in direct patient care.    Signed, Addison Naegeli. Audie Box, MD, [place]  04/11/2021 7:28 AM

## 2021-04-11 NOTE — Progress Notes (Signed)
Cardiology Progress Note  Patient ID: Gilbert Pierce MRN: [id number] DOB: May 20, 1971 Date of Encounter: 04/11/2021  Primary Cardiologist: None  Subjective   Chief Complaint: Chest pain  HPI: Describes intermittent chest pressure since yesterday.  No further episodes.  Cardiology consult team did not start heparin.  Was treated for recent URI.  Concern for myocarditis.  EKG without ischemic changes.  No positional changes to suggest pericarditis.  Family history of CAD.  NPO.  ROS:  All other ROS reviewed and negative. Pertinent positives noted in the HPI.     Inpatient Medications  Scheduled Meds:  amLODipine  5 mg Oral Daily   amoxicillin  500 mg Oral Q8H   aspirin EC  81 mg Oral Daily   atorvastatin  40 mg Oral Daily   cholestyramine  4 g Oral QHS   heparin  5,000 Units Subcutaneous Q8H   lisinopril  20 mg Oral Daily   metoprolol tartrate  12.5 mg Oral BID   metoprolol tartrate  25 mg Oral BID   metoprolol tartrate  50 mg Oral Once   pantoprazole  40 mg Oral Daily   sodium chloride flush  3 mL Intravenous Q12H   Continuous Infusions:  sodium chloride     PRN Meds: sodium chloride, acetaminophen, nitroGLYCERIN, ondansetron (ZOFRAN) IV, sodium chloride flush, zolpidem   Vital Signs   Vitals:   04/10/21 1946 04/10/21 2013 04/10/21 2021 04/11/21 0500  BP: (!) 151/99 (!) 146/94 140/90 132/84  Pulse: 72 66 71 69  Resp: 18   18  Temp: 98.2 F (36.8 C)   98.2 F (36.8 C)  TempSrc: Oral   Oral  SpO2: 97% 95% 96% 98%  Weight:      Height:       No intake or output data in the 24 hours ending 04/11/21 0728 Last 3 Weights 04/10/2021 04/10/2021 04/10/2021  Weight (lbs) 207 lb 0.2 oz 207 lb 0.2 oz 207 lb  Weight (kg) 93.9 kg 93.9 kg 93.895 kg      Telemetry  Overnight telemetry shows sinus rhythm in the 60s, which I personally reviewed.   ECG  The most recent ECG shows sinus rhythm heart rate 69, no acute ischemic changes or evidence of infarction, which I personally  reviewed.   Physical Exam   Vitals:   04/10/21 1946 04/10/21 2013 04/10/21 2021 04/11/21 0500  BP: (!) 151/99 (!) 146/94 140/90 132/84  Pulse: 72 66 71 69  Resp: 18   18  Temp: 98.2 F (36.8 C)   98.2 F (36.8 C)  TempSrc: Oral   Oral  SpO2: 97% 95% 96% 98%  Weight:      Height:       No intake or output data in the 24 hours ending 04/11/21 0728  Last 3 Weights 04/10/2021 04/10/2021 04/10/2021  Weight (lbs) 207 lb 0.2 oz 207 lb 0.2 oz 207 lb  Weight (kg) 93.9 kg 93.9 kg 93.895 kg    Body mass index is 28.87 kg/m.  General: Well nourished, well developed, in no acute distress Head: Atraumatic, normal size  Eyes: PEERLA, EOMI  Neck: Supple, no JVD Endocrine: No thryomegaly Cardiac: Normal S1, S2; RRR; no murmurs, rubs, or gallops Lungs: Clear to auscultation bilaterally, no wheezing, rhonchi or rales  Abd: Soft, nontender, no hepatomegaly  Ext: No edema, pulses 2+ Musculoskeletal: No deformities, BUE and BLE strength normal and equal Skin: Warm and dry, no rashes   Neuro: Alert and oriented to person, place, time, and situation, CNII-XII  grossly intact, no focal deficits  Psych: Normal mood and affect   Labs  High Sensitivity Troponin:   Recent Labs  Lab 04/10/21 0921 04/10/21 1136 04/10/21 1344 04/10/21 1641 04/11/21 0544  TROPONINIHS 3 113* 253* 445* 181*     Cardiac EnzymesNo results for input(s): TROPONINI in the last 168 hours. No results for input(s): TROPIPOC in the last 168 hours.  Chemistry Recent Labs  Lab 04/10/21 0921 04/10/21 1732 04/11/21 0241  NA 136  --  135  K 4.6  --  4.8  CL 101  --  100  CO2 27  --  26  GLUCOSE 119*  --  102*  BUN 16  --  13  CREATININE 1.19 1.10 1.26*  CALCIUM 9.1  --  9.1  PROT  --  7.3  --   ALBUMIN  --  3.6  --   AST  --  35  --   ALT  --  55*  --   ALKPHOS  --  54  --   BILITOT  --  1.1  --   GFRNONAA >60 >60 >60  ANIONGAP 8  --  9    Hematology Recent Labs  Lab 04/10/21 0921 04/10/21 1732 04/11/21 0241   WBC 9.6 9.2 10.4  RBC 5.52 5.59 5.39  HGB 16.8 16.3 16.2  HCT 49.3 49.6 48.1  MCV 89.3 88.7 89.2  MCH 30.4 29.2 30.1  MCHC 34.1 32.9 33.7  RDW 13.2 13.0 13.1  PLT 317 297 299   BNPNo results for input(s): BNP, PROBNP in the last 168 hours.  DDimer No results for input(s): DDIMER in the last 168 hours.   Radiology  DG Chest Portable 1 View  Result Date: 04/10/2021 CLINICAL DATA:  Chest pressure, sudden onset. EXAM: PORTABLE CHEST 1 VIEW COMPARISON:  Chest x-ray dated 06/08/2017. FINDINGS: Heart size and mediastinal contours are within normal limits. Lungs are clear. No pleural effusion or pneumothorax is seen. Osseous structures about the chest are unremarkable. IMPRESSION: No active disease. No evidence of pneumonia or pulmonary edema. Electronically Signed   By: Franki Cabot M.D.   On: 04/10/2021 09:41    Cardiac Studies  Echo pending  Patient Profile  Gilbert Pierce is a 50 y.o. male with Crohn's disease, GERD, hypertension, PAD (occluded L ulnar artery 2016) who was admitted on 04/10/2021 with chest pain and elevated troponin concerning for non-STEMI.  Assessment & Plan   1.  Non-STEMI -He presents with intermittent chest pressure.  Symptoms are nonpositional.  Cardiovascular examination is normal.  EKG unremarkable. -Strong family history of heart disease. -Recent URI.  Unclear if this is related. -Symptoms are concerning for unstable angina.  Would recommend to proceed with left heart catheterization.  I discussed with him that coronary CTA may not be the best test given his elevated troponins.  Troponins are minimally elevated and flat.  Concerns for possible myocarditis.  CRP marginally elevated.  ESR normal.  No concerning for pericarditis based on symptoms.  Would recommend to proceed with left heart catheterization today.  Risk and benefits explained.  He is willing to proceed. -On aspirin.  Continue high intensity statin.  BP well controlled.  On a beta-blocker.  Echo is  pending. -Further management pending left heart catheterization.  If normal coronaries will need cardiac MRI.  2.  Hypertension -Home BP medications  3.  Crohn's disease -No change to her medications  Shared Decision Making/Informed Consent The risks [stroke (1 in 1000), death (1 in 65), kidney  failure [usually temporary] (1 in 500), bleeding (1 in 200), allergic reaction [possibly serious] (1 in 200)], benefits (diagnostic support and management of coronary artery disease) and alternatives of a cardiac catheterization were discussed in detail with Mr. Buckman and he is willing to proceed.   FEN -Precath fluids -DVT Ppx: Heparin drip -Diet: N.p.o. -Code: Full  For questions or updates, please contact [place] Please consult [email or web address] for contact info under   Time Spent with Patient: I have spent a total of 35 minutes with patient reviewing hospital notes, telemetry, EKGs, labs and examining the patient as well as establishing an assessment and plan that was discussed with the patient.  > 50% of time was spent in direct patient care.    Signed, Addison Naegeli. Audie Box, MD, [place]  04/11/2021 7:28 AM

## 2021-04-11 NOTE — Interval H&P Note (Signed)
History and Physical Interval Note:  04/11/2021 12:29 PM  Gilbert Pierce  has presented today for surgery, with the diagnosis of nstemi.  The various methods of treatment have been discussed with the patient and family. After consideration of risks, benefits and other options for treatment, the patient has consented to  Procedure(s): LEFT HEART CATH AND CORONARY ANGIOGRAPHY (N/A) as a surgical intervention.  The patient's history has been reviewed, patient examined, no change in status, stable for surgery.  I have reviewed the patient's chart and labs.  Questions were answered to the patient's satisfaction.    Cath Lab Visit (complete for each Cath Lab visit)  Clinical Evaluation Leading to the Procedure:   ACS: Yes.    Non-ACS:    Anginal Classification: CCS III  Anti-ischemic medical therapy: Minimal Therapy (1 class of medications)  Non-Invasive Test Results: No non-invasive testing performed  Prior CABG: No previous CABG        Lauree Chandler

## 2021-04-11 NOTE — Progress Notes (Signed)
Echocardiogram 2D Echocardiogram has been performed.  Oneal Deputy Areta Terwilliger RDCS 04/11/2021, 8:26 AM

## 2021-04-11 NOTE — Discharge Instructions (Signed)

## 2021-04-13 ENCOUNTER — Telehealth: Payer: Self-pay

## 2021-04-13 NOTE — Telephone Encounter (Signed)
-----   Message from Margie Billet, NP sent at 04/11/2021  2:13 PM EDT ----- Regarding: follow up appointment Hi,  Patient is getting discharged today, please arranged follow up with Dr Audie Box in 2 weeks. He said it's ok to double book him.   Thanks!  Tonny Bollman

## 2021-04-13 NOTE — Telephone Encounter (Signed)
Called patient, LVM to call back to get him scheduled for post hospital visit.  Left call back number.

## 2021-04-14 NOTE — Telephone Encounter (Signed)
PT returning your phone call... please advise

## 2021-04-14 NOTE — Telephone Encounter (Signed)
Spoke to patient, advised of appointment- patient was scheduled for hospital follow up with Dr.O'Neal.  Patient verbalized understanding.

## 2021-04-24 NOTE — Progress Notes (Signed)
Cardiology Office Note:   Date:  04/26/2021  NAME:  Gilbert Pierce    MRN: [id number] DOB:  03-21-1971   PCP:  Wenda Low, MD  Cardiologist:  None  Electrophysiologist:  None   Referring MD: Wenda Low, MD   Chief Complaint  Patient presents with   Follow-up   History of Present Illness:   Gilbert Pierce is a 50 y.o. male with a hx of myopericarditis (04/11/2021), HLD, non-obstructive CAD who presents for follow-up. Admitted earlier this month with NSTEMI and found to have non-obstructive CAD. Diffuse ST elevation concerning for myopericarditis.   He reports he had 1 episode of chest pain.  Described as chest tightness with exertion.  Occurred roughly 1 week after hospitalization.  No further episodes.  He denies any trouble breathing.  BP was a bit low when he stopped his lisinopril.  BP in office 130/80.  Again he denies any further symptoms.  He has completed his course of ibuprofen.  He remains on colchicine.  He does describe intermittent palpitations.  They occur with exertion.  He needs a 3-day Zio patch to exclude any ventricular tachycardia.  He is had no syncopal episodes.  Overall he appears to be improving from his myopericarditis.  Symptoms have resolved.  He did have nonobstructive CAD.  At elevated triglycerides on fasting lipid profile.  He is on aspirin 81 mg daily.  Also on Lipitor 40 mg daily.  He will need repeat lab work in the next few weeks.  He is working on diet.  He does not need nitroglycerin.  Overall doing well.  We did discuss that he is restricted from heavy exertion.  He should avoid strenuous activity for the next 3 to 6 months.  Symptoms will improve.  Problem List Myopericarditis 04/11/2021 Non-obstructive CAD -30% LM; 20% mLAD; 30% dLAD 04/11/2021 3. HLD -T chol 173, HDL 42, LDL 85, TG 231 4. Crohn's Disease   Past Medical History: Past Medical History:  Diagnosis Date   Cancer of appendix ([place])    Coronary artery disease    Crohn disease  ([place])    Dyslipidemia    GERD (gastroesophageal reflux disease)    Herpes simplex    Hypertension    Non Hodgkin's lymphoma ([place])    Peripheral vascular disease ([place])    Left hand ischemia, Left hand small vascular disease, Left ulnar aneurysm with distal occlusion   Raynaud's disease     Past Surgical History: Past Surgical History:  Procedure Laterality Date   ARCH AORTOGRAM  02/22/2015   Procedure: Arch Aortogram;  Surgeon: Angelia Mould, MD;  Location: [facility];  Service: Cardiovascular;;   COLONOSCOPY  2018   EXPLORATORY LAPAROTOMY     Small intestine surgery    LEFT HEART CATH AND CORONARY ANGIOGRAPHY N/A 04/11/2021   Procedure: LEFT HEART CATH AND CORONARY ANGIOGRAPHY;  Surgeon: Burnell Blanks, MD;  Location: [facility];  Service: Cardiovascular;  Laterality: N/A;   PERIPHERAL VASCULAR CATHETERIZATION Left 02/22/2015   Procedure: Upper Extremity Angiography;  Surgeon: Angelia Mould, MD;  Location: [facility];  Service: Cardiovascular;  Laterality: Left;   REFRACTIVE SURGERY     SHOULDER ARTHROSCOPY W/ SUPERIOR LABRAL ANTERIOR POSTERIOR REPAIR     ULTRASOUND GUIDANCE FOR VASCULAR ACCESS Right 02/22/2015   Procedure: Ultrasound Guidance For Vascular Access;  Surgeon: Angelia Mould, MD;  Location: [facility];  Service: Cardiovascular;  Laterality: Right;    Current Medications: Current Meds  Medication Sig   acetaminophen (TYLENOL) 500 MG tablet Take 1,000 mg by mouth every 6 (six) hours as needed for moderate pain.   Adalimumab 40 MG/0.8ML PSKT Inject 40 mg into the skin every 14 (fourteen) days.   amLODipine (NORVASC) 5 MG tablet Take 5 mg by mouth daily.   amoxicillin (AMOXIL) 875 MG tablet Take 875 mg by mouth 2 (two) times daily. Start date : 04/07/21   aspirin 81 MG EC tablet Take 1 tablet (81 mg total) by mouth daily. Swallow whole.   atorvastatin (LIPITOR) 40 MG tablet Take 1 tablet (40 mg total) by mouth  daily.   cholecalciferol (VITAMIN D3) 25 MCG (1000 UNIT) tablet Take 2,000 Units by mouth daily.   cholestyramine (QUESTRAN) 4 g packet Take 4 g by mouth at bedtime.    colchicine 0.6 MG tablet Take 1 tablet (0.6 mg total) by mouth 2 (two) times daily.   cyanocobalamin (,VITAMIN B-12,) 1000 MCG/ML injection Inject 1,000 mcg into the skin every 21 ( twenty-one) days.    magnesium 30 MG tablet Take 30 mg by mouth daily.   metoprolol succinate (TOPROL-XL) 50 MG 24 hr tablet Take 1 tablet (50 mg total) by mouth daily. Take with or immediately following a meal.   Omega-3 Fatty Acids (FISH OIL) 1200 MG CAPS Take 1,200 mg by mouth daily.   omeprazole (PRILOSEC) 40 MG capsule Take 40 mg by mouth daily.   Pierce THE COUNTER MEDICATION Take 500 mg by mouth daily. Ginko Biloba   phentermine 30 MG capsule Take 30 mg by mouth daily as needed (depression).   tadalafil (CIALIS) 5 MG tablet Take 5 mg by mouth daily as needed for erectile dysfunction.   testosterone cypionate (DEPOTESTOSTERONE CYPIONATE) 200 MG/ML injection Inject 200 mg into the muscle every 14 (fourteen) days.   [DISCONTINUED] nitroGLYCERIN (NITROSTAT) 0.4 MG SL tablet Place 1 tablet (0.4 mg total) under the tongue every 5 (five) minutes x 3 doses as needed for chest pain.     Allergies:    Patient has no known allergies.   Social History: Social History   Socioeconomic History   Marital status: Married    Spouse name: Not on file   Number of children: 2   Years of education: Not on file   Highest education level: Not on file  Occupational History   Occupation: Sales Rep  Tobacco Use   Smoking status: Former    Types: Cigarettes    Start date: 02/16/2013   Smokeless tobacco: Never  Vaping Use   Vaping Use: Never used  Substance and Sexual Activity   Alcohol use: Yes    Alcohol/week: 3.0 - 6.0 standard drinks    Types: 1 - 2 Glasses of wine, 1 - 2 Cans of beer, 1 - 2 Shots of liquor per week   Drug use: Yes    Types: Marijuana    Sexual activity: Yes  Other Topics Concern   Not on file  Social History Narrative   Not on file   Social Determinants of Health   Financial Resource Strain: Not on file  Food Insecurity: Not on file  Transportation Needs: Not on file  Physical Activity: Not on file  Stress: Not on file  Social Connections: Not on file     Family History: The patient's family history includes Cancer in his mother; Heart disease in his father and mother; Hyperlipidemia in his father and mother; Hypertension in his brother, father, and mother.  ROS:   All other ROS  reviewed and negative. Pertinent positives noted in the HPI.     EKGs/Labs/Other Studies Reviewed:   The following studies were personally reviewed by me today:  TTE 04/11/2021  1. Left ventricular ejection fraction, by estimation, is 55 to 60%. The  left ventricle has normal function. The left ventricle has no regional  wall motion abnormalities. There is mild asymmetric left ventricular  hypertrophy of the basal-septal segment.  Left ventricular diastolic parameters are consistent with Grade I  diastolic dysfunction (impaired relaxation).   2. Right ventricular systolic function is normal. The right ventricular  size is normal. There is normal pulmonary artery systolic pressure.   3. The mitral valve is normal in structure. Trivial mitral valve  regurgitation.   4. The aortic valve is tricuspid. There is mild calcification of the  aortic valve. There is mild thickening of the aortic valve. Aortic valve  regurgitation is not visualized.   5. The inferior vena cava is normal in size with greater than 50%  respiratory variability, suggesting right atrial pressure of 3 mmHg.   Recent Labs: 04/10/2021: ALT 55; Magnesium 2.0; TSH 1.650 04/11/2021: BUN 13; Creatinine, Ser 1.26; Hemoglobin 16.2; Platelets 299; Potassium 4.8; Sodium 135   Recent Lipid Panel    Component Value Date/Time   CHOL 173 04/11/2021 0241   TRIG 231 (H)  04/11/2021 0241   HDL 42 04/11/2021 0241   CHOLHDL 4.1 04/11/2021 0241   VLDL 46 (H) 04/11/2021 0241   LDLCALC 85 04/11/2021 0241    Physical Exam:   VS:  BP 130/80   Pulse 91   Ht '5\' 10"'$  (1.778 m)   Wt 208 lb 9.6 oz (94.6 kg)   SpO2 98%   BMI 29.93 kg/m    Wt Readings from Last 3 Encounters:  04/26/21 208 lb 9.6 oz (94.6 kg)  04/10/21 207 lb 0.2 oz (93.9 kg)  12/04/18 245 lb (111.1 kg)    General: Well nourished, well developed, in no acute distress Head: Atraumatic, normal size  Eyes: PEERLA, EOMI  Neck: Supple, no JVD Endocrine: No thryomegaly Cardiac: Normal S1, S2; RRR; no murmurs, rubs, or gallops Lungs: Clear to auscultation bilaterally, no wheezing, rhonchi or rales  Abd: Soft, nontender, no hepatomegaly  Ext: No edema, pulses 2+ Musculoskeletal: No deformities, BUE and BLE strength normal and equal Skin: Warm and dry, no rashes   Neuro: Alert and oriented to person, place, time, and situation, CNII-XII grossly intact, no focal deficits  Psych: Normal mood and affect   ASSESSMENT:   HARKARAN GILLYARD is a 50 y.o. male who presents for the following: 1. Myopericarditis   2. Palpitations   3. Coronary artery disease involving native coronary artery of native heart without angina pectoris   4. Mixed hyperlipidemia   5. Primary hypertension     PLAN:   1. Myopericarditis 2. Palpitations -Diagnosed with myopericarditis on 04/11/2021.  Had elevated troponin with diffuse ST elevation.  Nonobstructive CAD on cath.  Echocardiogram shows normal LV function. -MRI was deferred. Classic symptoms of myopericarditis.  Troponin elevation clearly indicates myocardial involvement. -No further chest pain symptoms.  He completed ibuprofen.  We will continue colchicine 0.6 mg twice daily for 3 to 6 months. -He describes intermittent palpitations.  I recommended a 3-day Zio patch.  We need to exclude any ventricular arrhythmias.  Has had no syncopal episodes. -He was counseled on  activity restriction.  He will do no heavy lifting or strenuous activity for the next 3 to 6 months.  Light  activity such as walking is okay.  He should avoid any heavy lifting. -He describes a viral URI roughly 2 weeks before symptoms started.  I suspect this was the etiology.  He was COVID-negative.  Most recent COVID booster was 1 year ago.  I do not think this was vaccine related.  We deferred MRI as his LV function was normal.  This would not change his management.  3. Coronary artery disease involving native coronary artery of native heart without angina pectoris 4. Mixed hyperlipidemia -Nonobstructive CAD on cath.  I recommended aspirin.  We will continue Lipitor 40 mg daily.  Had elevated triglycerides.  He needs a repeat lipid profile.  We will see him back in 3 months and he will repeat a fasting lipid profile 1 week before that appointment.  Goal LDL less than 70.  Triglyceride goal less than 150.  5. Primary hypertension -BP stable.  He will continue Norvasc 5 mg daily and metoprolol succinate 50 mg daily.  He stopped lisinopril.  BP was a bit too low.     Disposition: Return in about 3 months (around 07/27/2021).  Medication Adjustments/Labs and Tests Ordered: Current medicines are reviewed at length with the patient today.  Concerns regarding medicines are outlined above.  Orders Placed This Encounter  Procedures   Lipid panel   LONG TERM MONITOR (3-14 DAYS)   No orders of the defined types were placed in this encounter.   Patient Instructions  Medication Instructions:  The current medical regimen is effective;  continue present plan and medications.  *If you need a refill on your cardiac medications before your next appointment, please call your pharmacy*   Lab Work: LIPID (one week before appointment in 3 months)   If you have labs (blood work) drawn today and your tests are completely normal, you will receive your results only by: [place] (if you have  MyChart) OR A paper copy in the mail If you have any lab test that is abnormal or we need to change your treatment, we will call you to review the results.   Testing/Procedures:  Bryn Gulling- Long Term Monitor Instructions  Your physician has requested you wear a ZIO patch monitor for 3 days.  This is a single patch monitor. [employer] supplies one patch monitor per enrollment. Additional stickers are not available. Please do not apply patch if you will be having a Nuclear Stress Test,  Echocardiogram, Cardiac CT, MRI, or Chest Xray during the period you would be wearing the  monitor. The patch cannot be worn during these tests. You cannot remove and re-apply the  ZIO XT patch monitor.  Your ZIO patch monitor will be mailed 3 day USPS to your address on file. It may take 3-5 days  to receive your monitor after you have been enrolled.  Once you have received your monitor, please review the enclosed instructions. Your monitor  has already been registered assigning a specific monitor serial # to you.  Billing and Patient Assistance Program Information  We have supplied [employer] with any of your insurance information on file for billing purposes. [employer] offers a sliding scale Patient Assistance Program for patients that do not have  insurance, or whose insurance does not completely cover the cost of the ZIO monitor.  You must apply for the Patient Assistance Program to qualify for this discounted rate.  To apply, please call [employer] at [phone], select option 4, select option 2, ask to apply for  Patient Assistance Program. Theodore Demark will  ask your household income, and how many people  are in your household. They will quote your out-of-pocket cost based on that information.  [employer] will also be able to set up a 2-month interest-free payment plan if needed.  Applying the monitor   Shave hair from upper left chest.  Hold abrader disc by orange tab. Rub abrader in 40 strokes Pierce the upper  left chest as  indicated in your monitor instructions.  Clean area with 4 enclosed alcohol pads. Let dry.  Apply patch as indicated in monitor instructions. Patch will be placed under collarbone on left  side of chest with arrow pointing upward.  Rub patch adhesive wings for 2 minutes. Remove white label marked "1". Remove the white  label marked "2". Rub patch adhesive wings for 2 additional minutes.  While looking in a mirror, press and release button in center of patch. A small green light will  flash 3-4 times. This will be your only indicator that the monitor has been turned on.  Do not shower for the first 24 hours. You may shower after the first 24 hours.  Press the button if you feel a symptom. You will hear a small click. Record Date, Time and  Symptom in the Patient Logbook.  When you are ready to remove the patch, follow instructions on the last 2 pages of Patient  Logbook. Stick patch monitor onto the last page of Patient Logbook.  Place Patient Logbook in the blue and white box. Use locking tab on box and tape box closed  securely. The blue and white box has prepaid postage on it. Please place it in the mailbox as  soon as possible. Your physician should have your test results approximately 7 days after the  monitor [facility]  Call IDucorat [phone] you have questions regarding  your ZIO XT patch monitor. Call them immediately if you see an orange light blinking on your  monitor.  If your monitor falls off in less than 4 days, contact our Monitor department at [phone]  If your monitor becomes loose or falls off after 4 days call [employer] at [phone]  suggestions on securing your monitor    Follow-Up: [facility] you and your health needs are our priority.  As part of our continuing mission to provide you with exceptional heart care, we have created designated Provider Care Teams.  These Care  Teams include your primary Cardiologist (physician) and Advanced Practice Providers (APPs -  Physician Assistants and Nurse Practitioners) who all work together to provide you with the care you need, when you need it.  We recommend signing up for the patient portal called "MyChart".  Sign up information is provided on this After Visit Summary.  MyChart is used to connect with patients for Virtual Visits (Telemedicine).  Patients are able to view lab/test results, encounter notes, upcoming appointments, etc.  Non-urgent messages can be sent to your provider as well.   To learn more about what you can do with MyChart, go to [email or web address]    Your next appointment:   3 month(s)  The format for your next appointment:   In Person  Provider:   WEleonore Chiquito MD   Other Instructions Monitor BP at home-    Time Spent with Patient: I have spent a total of 35 minutes with patient reviewing hospital notes, telemetry, EKGs, labs and examining the patient as well as establishing an assessment and plan that  was discussed with the patient.  > 50% of time was spent in direct patient care.  Signed, Addison Naegeli. Audie Box, MD, [place]  [address], [place], [place] [address] [phone]  04/26/2021 4:03 PM

## 2021-04-26 ENCOUNTER — Other Ambulatory Visit: Payer: Self-pay

## 2021-04-26 ENCOUNTER — Ambulatory Visit (INDEPENDENT_AMBULATORY_CARE_PROVIDER_SITE_OTHER): Payer: Managed Care, Other (non HMO)

## 2021-04-26 ENCOUNTER — Ambulatory Visit: Payer: Managed Care, Other (non HMO) | Admitting: Cardiovascular Disease

## 2021-04-26 ENCOUNTER — Encounter: Payer: Self-pay | Admitting: Cardiovascular Disease

## 2021-04-26 VITALS — BP 130/80 | HR 91 | Ht 70.0 in | Wt 208.6 lb

## 2021-04-26 DIAGNOSIS — R002 Palpitations: Secondary | ICD-10-CM | POA: Diagnosis not present

## 2021-04-26 DIAGNOSIS — I2510 Atherosclerotic heart disease of native coronary artery without angina pectoris: Secondary | ICD-10-CM

## 2021-04-26 DIAGNOSIS — I319 Disease of pericardium, unspecified: Secondary | ICD-10-CM | POA: Diagnosis not present

## 2021-04-26 DIAGNOSIS — E782 Mixed hyperlipidemia: Secondary | ICD-10-CM

## 2021-04-26 DIAGNOSIS — I10 Essential (primary) hypertension: Secondary | ICD-10-CM

## 2021-04-26 NOTE — Progress Notes (Unsigned)
Patient enrolled for [employer] to mail a 3 day ZIO XT monitor of his address on file.

## 2021-04-26 NOTE — Patient Instructions (Signed)
Medication Instructions:  The current medical regimen is effective;  continue present plan and medications.  *If you need a refill on your cardiac medications before your next appointment, please call your pharmacy*   Lab Work: LIPID (one week before appointment in 3 months)   If you have labs (blood work) drawn today and your tests are completely normal, you will receive your results only by: [place] (if you have MyChart) OR A paper copy in the mail If you have any lab test that is abnormal or we need to change your treatment, we will call you to review the results.   Testing/Procedures:  Bryn Gulling- Long Term Monitor Instructions  Your physician has requested you wear a ZIO patch monitor for 3 days.  This is a single patch monitor. [employer] supplies one patch monitor per enrollment. Additional stickers are not available. Please do not apply patch if you will be having a Nuclear Stress Test,  Echocardiogram, Cardiac CT, MRI, or Chest Xray during the period you would be wearing the  monitor. The patch cannot be worn during these tests. You cannot remove and re-apply the  ZIO XT patch monitor.  Your ZIO patch monitor will be mailed 3 day USPS to your address on file. It may take 3-5 days  to receive your monitor after you have been enrolled.  Once you have received your monitor, please review the enclosed instructions. Your monitor  has already been registered assigning a specific monitor serial # to you.  Billing and Patient Assistance Program Information  We have supplied [employer] with any of your insurance information on file for billing purposes. [employer] offers a sliding scale Patient Assistance Program for patients that do not have  insurance, or whose insurance does not completely cover the cost of the ZIO monitor.  You must apply for the Patient Assistance Program to qualify for this discounted rate.  To apply, please call [employer] at [phone], select option 4, select  option 2, ask to apply for  Patient Assistance Program. Theodore Demark will ask your household income, and how many people  are in your household. They will quote your out-of-pocket cost based on that information.  [employer] will also be able to set up a 42-month interest-free payment plan if needed.  Applying the monitor   Shave hair from upper left chest.  Hold abrader disc by orange tab. Rub abrader in 40 strokes over the upper left chest as  indicated in your monitor instructions.  Clean area with 4 enclosed alcohol pads. Let dry.  Apply patch as indicated in monitor instructions. Patch will be placed under collarbone on left  side of chest with arrow pointing upward.  Rub patch adhesive wings for 2 minutes. Remove white label marked "1". Remove the white  label marked "2". Rub patch adhesive wings for 2 additional minutes.  While looking in a mirror, press and release button in center of patch. A small green light will  flash 3-4 times. This will be your only indicator that the monitor has been turned on.  Do not shower for the first 24 hours. You may shower after the first 24 hours.  Press the button if you feel a symptom. You will hear a small click. Record Date, Time and  Symptom in the Patient Logbook.  When you are ready to remove the patch, follow instructions on the last 2 pages of Patient  Logbook. Stick patch monitor onto the last page of Patient Logbook.  Place Patient Logbook in the  blue and white box. Use locking tab on box and tape box closed  securely. The blue and white box has prepaid postage on it. Please place it in the mailbox as  soon as possible. Your physician should have your test results approximately 7 days after the  monitor has been mailed back to [facility].  Call [place] at [phone] if you have questions regarding  your ZIO XT patch monitor. Call them immediately if you see an orange light blinking on your  monitor.  If your monitor  falls off in less than 4 days, contact our Monitor department at [phone].  If your monitor becomes loose or falls off after 4 days call [employer] at [phone] for  suggestions on securing your monitor    Follow-Up: At [facility], you and your health needs are our priority.  As part of our continuing mission to provide you with exceptional heart care, we have created designated Provider Care Teams.  These Care Teams include your primary Cardiologist (physician) and Advanced Practice Providers (APPs -  Physician Assistants and Nurse Practitioners) who all work together to provide you with the care you need, when you need it.  We recommend signing up for the patient portal called "MyChart".  Sign up information is provided on this After Visit Summary.  MyChart is used to connect with patients for Virtual Visits (Telemedicine).  Patients are able to view lab/test results, encounter notes, upcoming appointments, etc.  Non-urgent messages can be sent to your provider as well.   To learn more about what you can do with MyChart, go to [email or web address].    Your next appointment:   3 month(s)  The format for your next appointment:   In Person  Provider:   Eleonore Chiquito, MD   Other Instructions Monitor BP at home-

## 2021-05-01 DIAGNOSIS — R002 Palpitations: Secondary | ICD-10-CM

## 2021-05-10 ENCOUNTER — Other Ambulatory Visit: Payer: Self-pay

## 2021-06-09 ENCOUNTER — Other Ambulatory Visit: Payer: Self-pay

## 2021-07-19 ENCOUNTER — Other Ambulatory Visit: Payer: Self-pay | Admitting: Home Health

## 2021-07-20 ENCOUNTER — Other Ambulatory Visit: Payer: Self-pay

## 2021-07-29 ENCOUNTER — Ambulatory Visit: Payer: Managed Care, Other (non HMO) | Admitting: Cardiovascular Disease

## 2021-08-08 ENCOUNTER — Encounter (HOSPITAL_BASED_OUTPATIENT_CLINIC_OR_DEPARTMENT_OTHER): Payer: Self-pay

## 2021-08-14 NOTE — Progress Notes (Signed)
Cardiology Office Note:   Date:  08/16/2021  NAME:  Gilbert Pierce    MRN: [id number] DOB:  1971/03/04   PCP:  Wenda Low, MD  Cardiologist:  None  Electrophysiologist:  None   Referring MD: Wenda Low, MD   Chief Complaint  Patient presents with   Chest Pain   History of Present Illness:   Gilbert Pierce is a 50 y.o. male with a hx of myopericarditis, non-obstructive CAD, HLD who presents for follow-up.  He reports for the last 2 weeks he has had a pressure in his left chest.  He reports it is constant.  It is not worsened by exercise or alleviated by cessation of activity.  There is no positional component to it.  He reports he had stopped taking most of his medications.  Apparently started back on omeprazole and symptoms are starting to improve.  Over the last few days he has noticed less of his symptoms.  The pain is not palpable.  Again not associated with position.  He reports no recent change in his life.  He reports his son will get married this weekend in [place].  His wife is a bit stressed but he appears to be handling this well.  Blood pressure slightly elevated today.  He informed me that he checks it at home and it is within normal limits.  He reports values between 120 and 70.  His EKG today shows no acute ischemic changes or evidence of prior infarction.  He has completed colchicine.  He stopped taking aspirin.  No other changes to medical history.  He is back on his Crohn's disease medication.  He is on adalimumab.  Problem List Myopericarditis 04/11/2021 Non-obstructive CAD -30% LM; 20% mLAD; 30% dLAD 04/11/2021 3. HLD -T chol 173, HDL 42, LDL 85, TG 231 4. Crohn's Disease   Past Medical History: Past Medical History:  Diagnosis Date   Cancer of appendix ([place])    Coronary artery disease    Crohn disease ([place])    Dyslipidemia    GERD (gastroesophageal reflux disease)    Herpes simplex    Hypertension    Non Hodgkin's lymphoma ([place])     Peripheral vascular disease ([place])    Left hand ischemia, Left hand small vascular disease, Left ulnar aneurysm with distal occlusion   Raynaud's disease     Past Surgical History: Past Surgical History:  Procedure Laterality Date   ARCH AORTOGRAM  02/22/2015   Procedure: Arch Aortogram;  Surgeon: Angelia Mould, MD;  Location: [facility];  Service: Cardiovascular;;   COLONOSCOPY  2018   EXPLORATORY LAPAROTOMY     Small intestine surgery    LEFT HEART CATH AND CORONARY ANGIOGRAPHY N/A 04/11/2021   Procedure: LEFT HEART CATH AND CORONARY ANGIOGRAPHY;  Surgeon: Burnell Blanks, MD;  Location: [facility];  Service: Cardiovascular;  Laterality: N/A;   PERIPHERAL VASCULAR CATHETERIZATION Left 02/22/2015   Procedure: Upper Extremity Angiography;  Surgeon: Angelia Mould, MD;  Location: [facility];  Service: Cardiovascular;  Laterality: Left;   REFRACTIVE SURGERY     SHOULDER ARTHROSCOPY W/ SUPERIOR LABRAL ANTERIOR POSTERIOR REPAIR     ULTRASOUND GUIDANCE FOR VASCULAR ACCESS Right 02/22/2015   Procedure: Ultrasound Guidance For Vascular Access;  Surgeon: Angelia Mould, MD;  Location: [facility];  Service: Cardiovascular;  Laterality: Right;    Current Medications: Current Meds  Medication Sig   Adalimumab 40 MG/0.8ML PSKT Inject 40 mg into the  skin every 14 (fourteen) days.   aspirin EC 81 MG tablet Take 1 tablet (81 mg total) by mouth daily. Swallow whole.   atorvastatin (LIPITOR) 40 MG tablet Take 1 tablet (40 mg total) by mouth daily.   cholecalciferol (VITAMIN D3) 25 MCG (1000 UNIT) tablet Take 2,000 Units by mouth daily.   cholestyramine (QUESTRAN) 4 g packet Take 4 g by mouth at bedtime.    cyanocobalamin (,VITAMIN B-12,) 1000 MCG/ML injection Inject 1,000 mcg into the skin every 21 ( twenty-one) days.    magnesium 30 MG tablet Take 30 mg by mouth daily.   Omega-3 Fatty Acids (FISH OIL) 1200 MG CAPS Take 1,200 mg by mouth daily.    omeprazole (PRILOSEC) 40 MG capsule Take 40 mg by mouth daily.   OVER THE COUNTER MEDICATION Take 500 mg by mouth daily. Ginko Biloba   phentermine 30 MG capsule Take 30 mg by mouth daily as needed (depression).   tadalafil (CIALIS) 5 MG tablet Take 5 mg by mouth daily as needed for erectile dysfunction.   testosterone cypionate (DEPOTESTOSTERONE CYPIONATE) 200 MG/ML injection Inject 200 mg into the muscle every 14 (fourteen) days.   [DISCONTINUED] colchicine 0.6 MG tablet Take 1 tablet (0.6 mg total) by mouth 2 (two) times daily.     Allergies:    Patient has no known allergies.   Social History: Social History   Socioeconomic History   Marital status: Married    Spouse name: Not on file   Number of children: 2   Years of education: Not on file   Highest education level: Not on file  Occupational History   Occupation: Sales Rep  Tobacco Use   Smoking status: Former    Types: Cigarettes    Start date: 02/16/2013   Smokeless tobacco: Never  Vaping Use   Vaping Use: Never used  Substance and Sexual Activity   Alcohol use: Yes    Alcohol/week: 3.0 - 6.0 standard drinks    Types: 1 - 2 Glasses of wine, 1 - 2 Cans of beer, 1 - 2 Shots of liquor per week   Drug use: Yes    Types: Marijuana   Sexual activity: Yes  Other Topics Concern   Not on file  Social History Narrative   Not on file   Social Determinants of Health   Financial Resource Strain: Not on file  Food Insecurity: Not on file  Transportation Needs: Not on file  Physical Activity: Not on file  Stress: Not on file  Social Connections: Not on file     Family History: The patient's family history includes Cancer in his mother; Heart disease in his father and mother; Hyperlipidemia in his father and mother; Hypertension in his brother, father, and mother.  ROS:   All other ROS reviewed and negative. Pertinent positives noted in the HPI.     EKGs/Labs/Other Studies Reviewed:   The following studies were  personally reviewed by me today:  EKG:  EKG is ordered today.  The ekg ordered today demonstrates normal sinus rhythm heart rate 84, no acute ischemic changes or evidence of infarction, and was personally reviewed by me.  [place] 04/26/2021 Impression: 1. No arrhythmias detected.  2. Rare ectopy.   TTE 04/11/2021  1. Left ventricular ejection fraction, by estimation, is 55 to 60%. The  left ventricle has normal function. The left ventricle has no regional  wall motion abnormalities. There is mild asymmetric left ventricular  hypertrophy of the basal-septal segment.  Left ventricular diastolic parameters  are consistent with Grade I  diastolic dysfunction (impaired relaxation).   2. Right ventricular systolic function is normal. The right ventricular  size is normal. There is normal pulmonary artery systolic pressure.   3. The mitral valve is normal in structure. Trivial mitral valve  regurgitation.   4. The aortic valve is tricuspid. There is mild calcification of the  aortic valve. There is mild thickening of the aortic valve. Aortic valve  regurgitation is not visualized.   5. The inferior vena cava is normal in size with greater than 50%  respiratory variability, suggesting right atrial pressure of 3 mmHg.   Recent Labs: 04/10/2021: ALT 55; Magnesium 2.0; TSH 1.650 04/11/2021: BUN 13; Creatinine, Ser 1.26; Hemoglobin 16.2; Platelets 299; Potassium 4.8; Sodium 135   Recent Lipid Panel    Component Value Date/Time   CHOL 173 04/11/2021 0241   TRIG 231 (H) 04/11/2021 0241   HDL 42 04/11/2021 0241   CHOLHDL 4.1 04/11/2021 0241   VLDL 46 (H) 04/11/2021 0241   LDLCALC 85 04/11/2021 0241    Physical Exam:   VS:  BP (!) 150/100 (BP Location: Left Arm)   Pulse 84   Ht 5' 10" (1.778 m)   Wt 219 lb 3.2 oz (99.4 kg)   SpO2 98%   BMI 31.45 kg/m    Wt Readings from Last 3 Encounters:  08/16/21 219 lb 3.2 oz (99.4 kg)  04/26/21 208 lb 9.6 oz (94.6 kg)  04/10/21 207 lb 0.2 oz (93.9 kg)     General: Well nourished, well developed, in no acute distress Head: Atraumatic, normal size  Eyes: PEERLA, EOMI  Neck: Supple, no JVD Endocrine: No thryomegaly Cardiac: Normal S1, S2; RRR; no murmurs, rubs, or gallops Lungs: Clear to auscultation bilaterally, no wheezing, rhonchi or rales  Abd: Soft, nontender, no hepatomegaly  Ext: No edema, pulses 2+ Musculoskeletal: No deformities, BUE and BLE strength normal and equal Skin: Warm and dry, no rashes   Neuro: Alert and oriented to person, place, time, and situation, CNII-XII grossly intact, no focal deficits  Psych: Normal mood and affect   ASSESSMENT:   Gilbert Pierce is a 50 y.o. male who presents for the following: 1. Myopericarditis   2. Coronary artery disease involving native coronary artery of native heart without angina pectoris   3. Mixed hyperlipidemia     PLAN:   1. Myopericarditis -Diagnosed with myopericarditis in July 2022.  He completed nearly 6 months of colchicine as well as a short course of ibuprofen. -He presents with recurrent chest pain symptoms.  Not exertional and not positional.  Appears to be improving with omeprazole.  I suspect his symptoms are acid reflux related. -I would like to check inflammatory markers including ESR and CRP just to make sure there is no recurrence of his pericarditis.  His EKG is normal in office so I do not suspect this to be likely. -He will see me back in 6 months.  I will get in touch with him about his results from his lab work today.  I think everything on his exam as well as EKG are reassuring today in office.  He was further reassured.  2. Coronary artery disease involving native coronary artery of native heart without angina pectoris 3. Mixed hyperlipidemia -Nonobstructive disease on left heart catheterization in July 2022.  He had stopped taking aspirin but should be taking aspirin 81 mg daily.  He is on Lipitor 40 mg daily.  He is fasting today so we will recheck  a  lipid profile. -Blood pressure elevated but he reports it is well controlled at home.  We will keep an eye on this. -He will see me back in 6 months.  Disposition: Return in about 6 months (around 02/13/2022).  Medication Adjustments/Labs and Tests Ordered: Current medicines are reviewed at length with the patient today.  Concerns regarding medicines are outlined above.  Orders Placed This Encounter  Procedures   Sedimentation rate   C-reactive protein   Lipid panel   EKG 12-Lead   Meds ordered this encounter  Medications   aspirin EC 81 MG tablet    Sig: Take 1 tablet (81 mg total) by mouth daily. Swallow whole.    Dispense:  90 tablet    Refill:  3    Patient Instructions  Medication Instructions:  STOP Colchicine  Start Aspirin 81 mg (this is over the counter)   *If you need a refill on your cardiac medications before your next appointment, please call your pharmacy*   Lab Work: ESR, CRP, LIPID today   If you have labs (blood work) drawn today and your tests are completely normal, you will receive your results only by: [place] (if you have MyChart) OR A paper copy in the mail If you have any lab test that is abnormal or we need to change your treatment, we will call you to review the results.   Follow-Up: At [facility] you and your health needs are our priority.  As part of our continuing mission to provide you with exceptional heart care, we have created designated Provider Care Teams.  These Care Teams include your primary Cardiologist (physician) and Advanced Practice Providers (APPs -  Physician Assistants and Nurse Practitioners) who all work together to provide you with the care you need, when you need it.  We recommend signing up for the patient portal called "MyChart".  Sign up information is provided on this After Visit Summary.  MyChart is used to connect with patients for Virtual Visits (Telemedicine).  Patients are able to view lab/test results,  encounter notes, upcoming appointments, etc.  Non-urgent messages can be sent to your provider as well.   To learn more about what you can do with MyChart, go to [email or web address].    Your next appointment:   6 month(s)  The format for your next appointment:   In Person  Provider:   Eleonore Chiquito, MD    Time Spent with Patient: I have spent a total of 35 minutes with patient reviewing hospital notes, telemetry, EKGs, labs and examining the patient as well as establishing an assessment and plan that was discussed with the patient.  > 50% of time was spent in direct patient care.  Signed, Addison Naegeli. Audie Box, MD, [facility]  [address] [place], [place] [address] [phone]  08/16/2021 9:04 AM

## 2021-08-16 ENCOUNTER — Ambulatory Visit: Payer: Managed Care, Other (non HMO) | Admitting: Cardiovascular Disease

## 2021-08-16 ENCOUNTER — Other Ambulatory Visit: Payer: Self-pay

## 2021-08-16 ENCOUNTER — Encounter: Payer: Self-pay | Admitting: Cardiovascular Disease

## 2021-08-16 VITALS — BP 150/100 | HR 84 | Ht 70.0 in | Wt 219.2 lb

## 2021-08-16 DIAGNOSIS — I319 Disease of pericardium, unspecified: Secondary | ICD-10-CM | POA: Diagnosis not present

## 2021-08-16 DIAGNOSIS — E782 Mixed hyperlipidemia: Secondary | ICD-10-CM

## 2021-08-16 DIAGNOSIS — I2510 Atherosclerotic heart disease of native coronary artery without angina pectoris: Secondary | ICD-10-CM

## 2021-08-16 MED ORDER — ASPIRIN EC 81 MG PO TBEC: 81.0000 mg | DELAYED_RELEASE_TABLET | Freq: Every day | ORAL | 3 refills | Status: AC

## 2021-08-16 NOTE — Patient Instructions (Signed)
Medication Instructions:  STOP Colchicine  Start Aspirin 81 mg (this is over the counter)   *If you need a refill on your cardiac medications before your next appointment, please call your pharmacy*   Lab Work: ESR, CRP, LIPID today   If you have labs (blood work) drawn today and your tests are completely normal, you will receive your results only by: [place] (if you have MyChart) OR A paper copy in the mail If you have any lab test that is abnormal or we need to change your treatment, we will call you to review the results.   Follow-Up: At [facility], you and your health needs are our priority.  As part of our continuing mission to provide you with exceptional heart care, we have created designated Provider Care Teams.  These Care Teams include your primary Cardiologist (physician) and Advanced Practice Providers (APPs -  Physician Assistants and Nurse Practitioners) who all work together to provide you with the care you need, when you need it.  We recommend signing up for the patient portal called "MyChart".  Sign up information is provided on this After Visit Summary.  MyChart is used to connect with patients for Virtual Visits (Telemedicine).  Patients are able to view lab/test results, encounter notes, upcoming appointments, etc.  Non-urgent messages can be sent to your provider as well.   To learn more about what you can do with MyChart, go to [email or web address].    Your next appointment:   6 month(s)  The format for your next appointment:   In Person  Provider:   Eleonore Chiquito, MD

## 2021-08-17 LAB — LIPID PANEL
Chol/HDL Ratio: 2.6 ratio (ref 0.0–5.0)
Cholesterol, Total: 143 mg/dL (ref 100–199)
HDL: 56 mg/dL (ref >39)
LDL Chol Calc (NIH): 53 mg/dL (ref 0–99)
Triglycerides: 212 mg/dL — ABNORMAL HIGH (ref 0–149)
VLDL Cholesterol Cal: 34 mg/dL (ref 5–40)

## 2021-08-17 LAB — SEDIMENTATION RATE: Sed Rate: 2 mm/hr (ref 0–30)

## 2021-08-17 LAB — C-REACTIVE PROTEIN: CRP: <1 mg/L (ref 0–10)

## 2021-08-31 ENCOUNTER — Other Ambulatory Visit: Payer: Self-pay

## 2021-09-19 ENCOUNTER — Other Ambulatory Visit: Payer: Self-pay

## 2021-10-08 ENCOUNTER — Other Ambulatory Visit (HOSPITAL_COMMUNITY): Payer: Self-pay

## 2021-10-25 ENCOUNTER — Ambulatory Visit (HOSPITAL_BASED_OUTPATIENT_CLINIC_OR_DEPARTMENT_OTHER): Payer: Managed Care, Other (non HMO) | Admitting: Cardiovascular Disease

## 2021-11-08 ENCOUNTER — Other Ambulatory Visit: Payer: Self-pay | Admitting: Home Health

## 2021-11-08 ENCOUNTER — Other Ambulatory Visit (HOSPITAL_COMMUNITY): Payer: Self-pay

## 2021-11-08 MED ORDER — ATORVASTATIN CALCIUM 40 MG PO TABS
40.0000 mg | ORAL_TABLET | Freq: Every day | ORAL | 1 refills | Status: DC
  Filled 2021-11-08: qty 30, 30d supply, fill #0

## 2021-11-11 ENCOUNTER — Encounter: Payer: Self-pay | Admitting: Cardiovascular Disease

## 2021-11-14 ENCOUNTER — Other Ambulatory Visit: Payer: Self-pay

## 2021-11-14 MED ORDER — ATORVASTATIN CALCIUM 40 MG PO TABS: 40.0000 mg | ORAL_TABLET | Freq: Every day | ORAL | 0 refills | Status: DC

## 2021-12-14 ENCOUNTER — Other Ambulatory Visit: Payer: Self-pay | Admitting: Cardiovascular Disease

## 2021-12-15 ENCOUNTER — Other Ambulatory Visit (HOSPITAL_COMMUNITY): Payer: Self-pay

## 2021-12-15 MED ORDER — ATORVASTATIN CALCIUM 40 MG PO TABS
40.0000 mg | ORAL_TABLET | Freq: Every day | ORAL | 1 refills | Status: DC
  Filled 2021-12-15: qty 30, 30d supply, fill #0

## 2021-12-28 ENCOUNTER — Other Ambulatory Visit: Payer: Self-pay

## 2021-12-28 MED ORDER — ATORVASTATIN CALCIUM 40 MG PO TABS: 40.0000 mg | ORAL_TABLET | Freq: Every day | ORAL | 3 refills | Status: DC

## 2022-02-06 ENCOUNTER — Encounter: Payer: Self-pay | Admitting: Cardiovascular Disease

## 2022-02-06 MED ORDER — ATORVASTATIN CALCIUM 40 MG PO TABS: 40.0000 mg | ORAL_TABLET | Freq: Every day | ORAL | 1 refills | Status: DC

## 2022-03-20 NOTE — Progress Notes (Unsigned)
Cardiology Office Note:   Date:  03/22/2022  NAME:  Gilbert Pierce    MRN: [id number] DOB:  January 19, 1971   PCP:  Wenda Low, MD  Cardiologist:  None  Electrophysiologist:  None   Referring MD: Wenda Low, MD   Chief Complaint  Patient presents with   Follow-up        History of Present Illness:   Gilbert Pierce is a 51 y.o. male with a hx of myopericarditis who presents for follow-up.  He reports for the last 2 weeks he has had a pressure-like sensation in his chest.  Symptoms can be exacerbated by stress.  He reports a lot is going on with work.  Also reports that his father is aging and they are looking for a facility for placement.  He reports inactivity.  Not exercising.  Diet could also be improved.  Weight is up several pounds from last year.  His cholesterol levels are at goal.  His blood pressure is controlled.  He is currently on amlodipine and benazepril.  Denies any exertional symptoms but not exercising.  No shortness of breath.  Left heart catheterization last year with nonobstructive CAD.  Problem List Myopericarditis 04/11/2021 Non-obstructive CAD -30% LM; 20% mLAD; 30% dLAD 04/11/2021 3. HLD -T chol 113, HDL 47, LDL 46, triglycerides 108 4. Crohn's Disease  5. HTN  Past Medical History: Past Medical History:  Diagnosis Date   Cancer of appendix ([place])    Coronary artery disease    Crohn disease ([place])    Dyslipidemia    GERD (gastroesophageal reflux disease)    Herpes simplex    Hypertension    Non Hodgkin's lymphoma ([place])    Peripheral vascular disease ([place])    Left hand ischemia, Left hand small vascular disease, Left ulnar aneurysm with distal occlusion   Raynaud's disease     Past Surgical History: Past Surgical History:  Procedure Laterality Date   ARCH AORTOGRAM  02/22/2015   Procedure: Arch Aortogram;  Surgeon: Angelia Mould, MD;  Location: [facility];  Service: Cardiovascular;;   COLONOSCOPY  2018   EXPLORATORY LAPAROTOMY      Small intestine surgery    LEFT HEART CATH AND CORONARY ANGIOGRAPHY N/A 04/11/2021   Procedure: LEFT HEART CATH AND CORONARY ANGIOGRAPHY;  Surgeon: Burnell Blanks, MD;  Location: [facility];  Service: Cardiovascular;  Laterality: N/A;   PERIPHERAL VASCULAR CATHETERIZATION Left 02/22/2015   Procedure: Upper Extremity Angiography;  Surgeon: Angelia Mould, MD;  Location: [facility];  Service: Cardiovascular;  Laterality: Left;   REFRACTIVE SURGERY     SHOULDER ARTHROSCOPY W/ SUPERIOR LABRAL ANTERIOR POSTERIOR REPAIR     ULTRASOUND GUIDANCE FOR VASCULAR ACCESS Right 02/22/2015   Procedure: Ultrasound Guidance For Vascular Access;  Surgeon: Angelia Mould, MD;  Location: [facility];  Service: Cardiovascular;  Laterality: Right;    Current Medications: Current Meds  Medication Sig   amLODipine-benazepril (LOTREL) 5-20 MG capsule Take 1 capsule by mouth daily.     Allergies:    Patient has no known allergies.   Social History: Social History   Socioeconomic History   Marital status: Married    Spouse name: Not on file   Number of children: 2   Years of education: Not on file   Highest education level: Not on file  Occupational History   Occupation: Sales Rep  Tobacco Use   Smoking status: Former    Types: Cigarettes    Start  date: 02/16/2013   Smokeless tobacco: Never  Vaping Use   Vaping Use: Never used  Substance and Sexual Activity   Alcohol use: Yes    Alcohol/week: 3.0 - 6.0 standard drinks of alcohol    Types: 1 - 2 Glasses of wine, 1 - 2 Cans of beer, 1 - 2 Shots of liquor per week   Drug use: Yes    Types: Marijuana   Sexual activity: Yes  Other Topics Concern   Not on file  Social History Narrative   Not on file   Social Determinants of Health   Financial Resource Strain: Not on file  Food Insecurity: Not on file  Transportation Needs: Not on file  Physical Activity: Not on file  Stress: Not on file  Social  Connections: Not on file     Family History: The patient's family history includes Cancer in his mother; Heart disease in his father and mother; Hyperlipidemia in his father and mother; Hypertension in his brother, father, and mother.  ROS:   All other ROS reviewed and negative. Pertinent positives noted in the HPI.     EKGs/Labs/Other Studies Reviewed:   The following studies were personally reviewed by me today:  EKG:  EKG is ordered today.  The ekg ordered today demonstrates normal sinus rhythm heart rate 78, no acute ischemic changes or evidence of infarction, and was personally reviewed by me.   LHC 04/11/2021 Ost LM to Mid LM lesion is 30% stenosed. Mid LAD lesion is 20% stenosed. Dist LAD lesion is 30% stenosed.   Mild non-obstructive CAD Normal LV filling pressures   Recommendations: Medical management of mild CAD. Consider cardiac MRI for evaluation of possible myopericarditis.   Recent Labs: 04/10/2021: ALT 55; Magnesium 2.0; TSH 1.650 04/11/2021: BUN 13; Creatinine, Ser 1.26; Hemoglobin 16.2; Platelets 299; Potassium 4.8; Sodium 135   Recent Lipid Panel    Component Value Date/Time   CHOL 143 08/16/2021 0854   TRIG 212 (H) 08/16/2021 0854   HDL 56 08/16/2021 0854   CHOLHDL 2.6 08/16/2021 0854   CHOLHDL 4.1 04/11/2021 0241   VLDL 46 (H) 04/11/2021 0241   LDLCALC 53 08/16/2021 0854    Physical Exam:   VS:  BP 140/88   Pulse 82   Ht '5\' 10"'$  (1.778 m)   Wt 219 lb (99.3 kg)   SpO2 99%   BMI 31.42 kg/m    Wt Readings from Last 3 Encounters:  03/22/22 219 lb (99.3 kg)  08/16/21 219 lb 3.2 oz (99.4 kg)  04/26/21 208 lb 9.6 oz (94.6 kg)    General: Well nourished, well developed, in no acute distress Head: Atraumatic, normal size  Eyes: PEERLA, EOMI  Neck: Supple, no JVD Endocrine: No thryomegaly Cardiac: Normal S1, S2; RRR; no murmurs, rubs, or gallops Lungs: Clear to auscultation bilaterally, no wheezing, rhonchi or rales  Abd: Soft, nontender, no  hepatomegaly  Ext: No edema, pulses 2+ Musculoskeletal: No deformities, BUE and BLE strength normal and equal Skin: Warm and dry, no rashes   Neuro: Alert and oriented to person, place, time, and situation, CNII-XII grossly intact, no focal deficits  Psych: Normal mood and affect   ASSESSMENT:   KASEEM VASTINE is a 51 y.o. male who presents for the following: 1. Myopericarditis   2. Coronary artery disease involving native coronary artery of native heart without angina pectoris   3. Mixed hyperlipidemia     PLAN:   1. Myopericarditis -History of myopericarditis.  CRP is normalized.  Reports symptoms  of chest discomfort for the last 2 weeks.  Stress related.  EKG is unchanged.  Exam normal.  We discussed that this is likely stress related.  We will keep an eye on it.  Recommend exercise and weight loss.  We will see how he does and if symptoms do not resolve would consider further testing.  Nonobstructive CAD on left heart cath.  I really have a low suspicion for underlying cardiac pathology.  2. Coronary artery disease involving native coronary artery of native heart without angina pectoris 3. Mixed hyperlipidemia -Nonobstructive CAD.  On aspirin.  On Lipitor.  Most recent LDL cholesterol at goal.  EKG unchanged.  Blood pressure control.  Symptoms of chest discomfort likely are stress related.      Disposition: Return in about 1 year (around 03/23/2023).  Medication Adjustments/Labs and Tests Ordered: Current medicines are reviewed at length with the patient today.  Concerns regarding medicines are outlined above.  No orders of the defined types were placed in this encounter.  No orders of the defined types were placed in this encounter.   Patient Instructions  Medication Instructions:  The current medical regimen is effective;  continue present plan and medications.  *If you need a refill on your cardiac medications before your next appointment, please call your  pharmacy*   Follow-Up: At [facility], you and your health needs are our priority.  As part of our continuing mission to provide you with exceptional heart care, we have created designated Provider Care Teams.  These Care Teams include your primary Cardiologist (physician) and Advanced Practice Providers (APPs -  Physician Assistants and Nurse Practitioners) who all work together to provide you with the care you need, when you need it.  We recommend signing up for the patient portal called "MyChart".  Sign up information is provided on this After Visit Summary.  MyChart is used to connect with patients for Virtual Visits (Telemedicine).  Patients are able to view lab/test results, encounter notes, upcoming appointments, etc.  Non-urgent messages can be sent to your provider as well.   To learn more about what you can do with MyChart, go to [email or web address].    Your next appointment:   12 month(s)  The format for your next appointment:   In Person  Provider:   Eleonore Chiquito, MD          Time Spent with Patient: I have spent a total of 25 minutes with patient reviewing hospital notes, telemetry, EKGs, labs and examining the patient as well as establishing an assessment and plan that was discussed with the patient.  > 50% of time was spent in direct patient care.  Signed, Addison Naegeli. Audie Box, MD, [place]  [address], [place],  [address] [phone]  03/22/2022 3:36 PM

## 2022-03-22 ENCOUNTER — Ambulatory Visit: Payer: Managed Care, Other (non HMO) | Admitting: Cardiovascular Disease

## 2022-03-22 ENCOUNTER — Encounter: Payer: Self-pay | Admitting: Cardiovascular Disease

## 2022-03-22 VITALS — BP 140/88 | HR 82 | Ht 70.0 in | Wt 219.0 lb

## 2022-03-22 DIAGNOSIS — I2510 Atherosclerotic heart disease of native coronary artery without angina pectoris: Secondary | ICD-10-CM | POA: Diagnosis not present

## 2022-03-22 DIAGNOSIS — E782 Mixed hyperlipidemia: Secondary | ICD-10-CM | POA: Diagnosis not present

## 2022-03-22 DIAGNOSIS — I319 Disease of pericardium, unspecified: Secondary | ICD-10-CM | POA: Diagnosis not present

## 2022-03-22 NOTE — Patient Instructions (Signed)

## 2022-07-13 ENCOUNTER — Other Ambulatory Visit: Payer: Self-pay | Admitting: Cardiovascular Disease

## 2022-11-07 IMAGING — DX DG CHEST 1V PORT
1 series · 1 of 1 positions shown · non-contrast
Comparison: Chest x-ray dated 06/08/2017.

CLINICAL DATA: Chest pressure, sudden onset.

EXAM:
PORTABLE CHEST 1 VIEW

[chest ap]
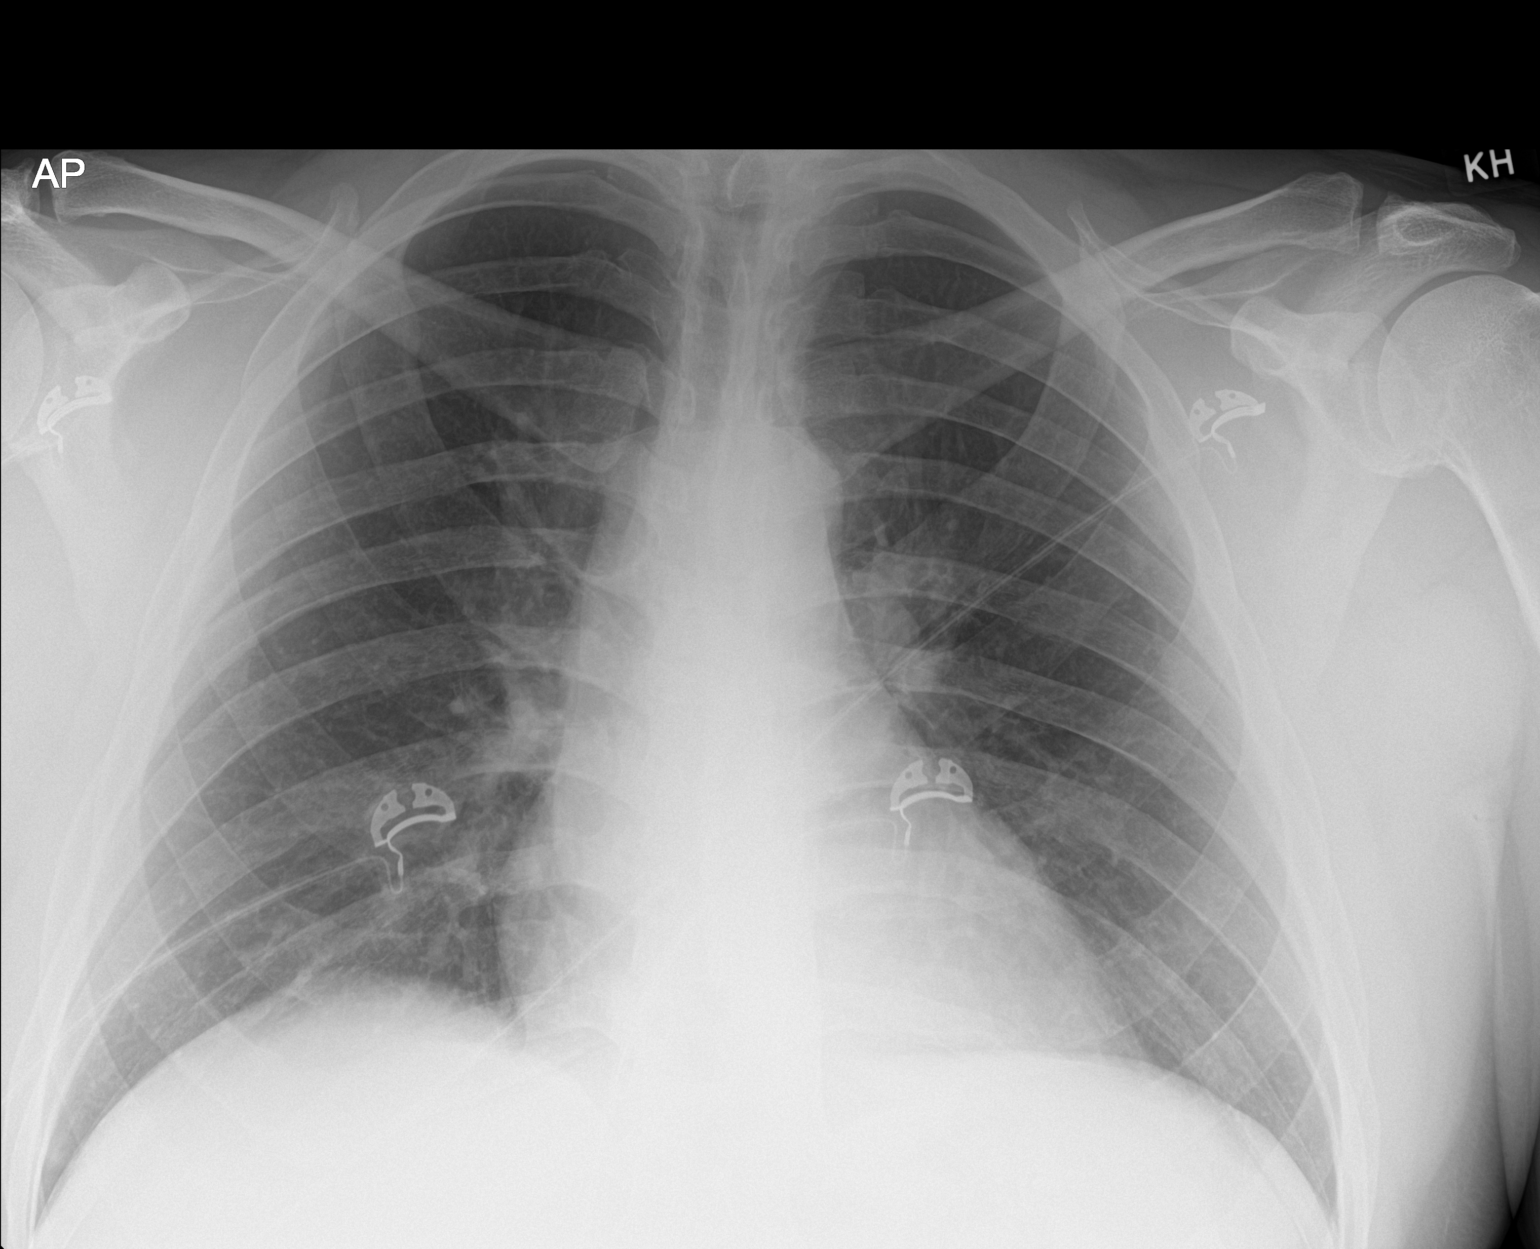

[1 of 1 positions shown; findings below may reference images not displayed]

FINDINGS: Heart size and mediastinal contours are within normal limits. Lungs
are clear. No pleural effusion or pneumothorax is seen. Osseous
structures about the chest are unremarkable.
IMPRESSION: No active disease. No evidence of pneumonia or pulmonary edema.

## 2023-11-13 NOTE — Progress Notes (Unsigned)
Cardiology Office Note:  .   Date:  11/14/2023  ID:  Gilbert Pierce, DOB April 24, 1971, MRN [id number] PCP: Georgann Housekeeper, MD  [facility] Providers Cardiologist:  None {  History of Present Illness: .    Chief Complaint  Patient presents with   Follow-up    1 year.   Shortness of Breath    Gilbert Pierce is a 53 y.o. male with history of myopericarditis who presents for follow-up.   History of Present Illness   Gilbert Pierce "Nadine Counts" is a 53 year old male with non-obstructive coronary artery disease and myocarditis who presents for follow-up.  He has experienced increased shortness of breath with activity over the past two to three months, describing it as feeling 'gassed real easy' and 'tired all the time,' especially when climbing stairs. He attributes these symptoms to being out of shape and has recently started exercising again, though he has not noticed significant improvement yet. No habitual snoring or symptoms of sleep apnea, but occasional snoring occurs after alcohol consumption.  He mentions a dull pain lasting about two days, initially thought to be indigestion, which caused anxiety and concern about his health. This has motivated him to improve his overall well-being through health and exercise routines.  He has not been on Humira since December 2023 due to remission of Crohn's disease, confirmed by a colonoscopy three weeks ago. He no longer takes Cialis or Ginkgo but continues on aspirin and Lipitor 40 mg for cholesterol management. His most recent LDL cholesterol was 43, which is at goal. He plans to have his lipids rechecked in March. He continues to take amlodipine 5 mg daily and benazepril 20 mg daily for hypertension, which is well controlled.  He works in Air[employer] pilot, which involves sitting and driving. He has two children, one of whom is married and moving back to [place]. He is motivated to improve his health and has been dieting and exercising, aiming to reduce  his weight to 200 pounds.          Problem List Myopericarditis 04/11/2021 Non-obstructive CAD -30% LM; 20% mLAD; 30% dLAD 04/11/2021 3. HLD -T chol 113, HDL 47, LDL 46, triglycerides 108 4. Crohn's Disease  5. HTN    ROS: All other ROS reviewed and negative. Pertinent positives noted in the HPI.     Studies Reviewed: Marland Kitchen   EKG Interpretation Date/Time:  Wednesday November 14 2023 16:24:19 EST Ventricular Rate:  91 PR Interval:  196 QRS Duration:  96 QT Interval:  340 QTC Calculation: 418 R Axis:   48  Text Interpretation: Normal sinus rhythm Normal ECG Confirmed by Lennie Odor [phone]) on 11/14/2023 4:27:31 PM    LHC 04/11/2021 Ost LM to Mid LM lesion is 30% stenosed. Mid LAD lesion is 20% stenosed. Dist LAD lesion is 30% stenosed.   Mild non-obstructive CAD Normal LV filling pressures   Recommendations: Medical management of mild CAD. Consider cardiac MRI for evaluation of possible myopericarditis.   TTE 04/11/2021  1. Left ventricular ejection fraction, by estimation, is 55 to 60%. The  left ventricle has normal function. The left ventricle has no regional  wall motion abnormalities. There is mild asymmetric left ventricular  hypertrophy of the basal-septal segment.  Left ventricular diastolic parameters are consistent with Grade I  diastolic dysfunction (impaired relaxation).   2. Right ventricular systolic function is normal. The right ventricular  size is normal. There is normal pulmonary artery systolic pressure.   3. The mitral valve is normal  in structure. Trivial mitral valve  regurgitation.   4. The aortic valve is tricuspid. There is mild calcification of the  aortic valve. There is mild thickening of the aortic valve. Aortic valve  regurgitation is not visualized.   5. The inferior vena cava is normal in size with greater than 50%  respiratory variability, suggesting right atrial pressure of 3 mmHg.  Physical Exam:   VS:  BP 134/82 (BP Location: Right  Arm, Patient Position: Sitting, Cuff Size: Normal)   Pulse 91   Ht 5\' 11"  (1.803 m)   Wt 217 lb (98.4 kg)   BMI 30.27 kg/m    Wt Readings from Last 3 Encounters:  11/14/23 217 lb (98.4 kg)  03/22/22 219 lb (99.3 kg)  08/16/21 219 lb 3.2 oz (99.4 kg)    GEN: Well nourished, well developed in no acute distress NECK: No JVD; No carotid bruits CARDIAC: RRR, no murmurs, rubs, gallops RESPIRATORY:  Clear to auscultation without rales, wheezing or rhonchi  ABDOMEN: Soft, non-tender, non-distended EXTREMITIES:  No edema; No deformity  ASSESSMENT AND PLAN: .   Assessment and Plan    Shortness of Breath Recent onset, exacerbated by physical activity. Likely due to deconditioning, but given history of myocarditis and nonobstructive CAD, further evaluation warranted. -EKG normal. Exam normal.  -Order echocardiogram to rule out cardiac etiology.  Nonobstructive CAD and Hyperlipidemia LDL at goal (43 last year). On Aspirin and Lipitor 40mg  daily. -Continue Aspirin and Lipitor 40mg  daily. -Forward cholesterol results after next check in March 2025.  Hypertension Well controlled on current regimen. -Continue Amlodipine 5mg  daily and Benazepril 20mg  daily.  General Health Maintenance / Followup Plans -Encourage continued physical activity and weight loss efforts. -Plan for 1-year follow-up, or sooner if needed.              Follow-up: Return in about 1 year (around 11/13/2024).  Signed, Lenna Gilford. Flora Lipps, MD, [facility]  [facility]  [address] [place], [place] [address] [phone]  4:50 PM

## 2023-11-14 ENCOUNTER — Encounter: Payer: Self-pay | Admitting: Cardiovascular Disease

## 2023-11-14 ENCOUNTER — Ambulatory Visit: Payer: Managed Care, Other (non HMO) | Attending: Cardiovascular Disease | Admitting: Cardiovascular Disease

## 2023-11-14 VITALS — BP 134/82 | HR 91 | Ht 71.0 in | Wt 217.0 lb

## 2023-11-14 DIAGNOSIS — I200 Unstable angina: Secondary | ICD-10-CM

## 2023-11-14 DIAGNOSIS — R0602 Shortness of breath: Secondary | ICD-10-CM | POA: Diagnosis not present

## 2023-11-14 DIAGNOSIS — I401 Isolated myocarditis: Secondary | ICD-10-CM | POA: Diagnosis not present

## 2023-11-14 DIAGNOSIS — R079 Chest pain, unspecified: Secondary | ICD-10-CM

## 2023-11-14 DIAGNOSIS — E782 Mixed hyperlipidemia: Secondary | ICD-10-CM

## 2023-11-14 DIAGNOSIS — R072 Precordial pain: Secondary | ICD-10-CM

## 2023-11-14 DIAGNOSIS — I214 Non-ST elevation (NSTEMI) myocardial infarction: Secondary | ICD-10-CM

## 2023-11-14 DIAGNOSIS — I2510 Atherosclerotic heart disease of native coronary artery without angina pectoris: Secondary | ICD-10-CM | POA: Diagnosis not present

## 2023-11-14 NOTE — Patient Instructions (Addendum)
Medication Instructions:  Your physician recommends that you continue on your current medications as directed. Please refer to the Current Medication list given to you today.  *If you need a refill on your cardiac medications before your next appointment, please call your pharmacy*  Testing/Procedures: Your physician has requested that you have an echocardiogram. Echocardiography is a painless test that uses sound waves to create images of your heart. It provides your doctor with information about the size and shape of your heart and how well your heart's chambers and valves are working. This procedure takes approximately one hour. There are no restrictions for this procedure. Please do NOT wear cologne, perfume, aftershave, or lotions (deodorant is allowed). Please arrive 15 minutes prior to your appointment time.  Please note: We ask at that you not bring children with you during ultrasound (echo/ vascular) testing. Due to room size and safety concerns, children are not allowed in the ultrasound rooms during exams. Our front office staff cannot provide observation of children in our lobby area while testing is being conducted. An adult accompanying a patient to their appointment will only be allowed in the ultrasound room at the discretion of the ultrasound technician under special circumstances. We apologize for any inconvenience.    Follow-Up: At [facility], you and your health needs are our priority.  As part of our continuing mission to provide you with exceptional heart care, we have created designated Provider Care Teams.  These Care Teams include your primary Cardiologist (physician) and Advanced Practice Providers (APPs -  Physician Assistants and Nurse Practitioners) who all work together to provide you with the care you need, when you need it.   Your next appointment:   1 year(s)  Provider:   Lennie Odor, MD

## 2023-12-04 LAB — LAB REPORT - SCANNED
A1c: 6
EGFR: 77

## 2023-12-05 ENCOUNTER — Encounter: Payer: Self-pay | Admitting: Cardiovascular Disease

## 2023-12-05 ENCOUNTER — Encounter: Payer: Self-pay | Admitting: Internal Medicine

## 2023-12-07 ENCOUNTER — Ambulatory Visit (HOSPITAL_COMMUNITY): Payer: Managed Care, Other (non HMO) | Attending: Cardiology

## 2023-12-07 DIAGNOSIS — R0602 Shortness of breath: Secondary | ICD-10-CM | POA: Insufficient documentation

## 2023-12-07 LAB — ECHOCARDIOGRAM COMPLETE
Area-P 1/2: 3.85 cm2
S' Lateral: 2.7 cm

## 2023-12-09 ENCOUNTER — Encounter: Payer: Self-pay | Admitting: Cardiovascular Disease

## 2023-12-11 NOTE — Progress Notes (Signed)
 Chief Complaint: No chief complaint on file.   History of Present Illness:  Gilbert Pierce is a 53 y.o. male who is seen in consultation from Georgann Housekeeper, MD for evaluation of lower urinary tract symptomatology.  This is his first urologic consultation.  He has been on testosterone repletion for approximately 9 months.  Prior to initiation of therapy, his testosterone level in the morning was 115.  He takes 0.4 mL twice a week.  His peak levels have been under 900.  He does give blood quarterly.  Most recent hemoglobin was normal.  He also has his estrogen level checked which have been normal by report.  His urinary symptoms have become a bit more bothersome over the past few months, since repletion was started.  Biggest issue is frequency.  With a full bladder, he has hesitancy and intermittency.  He usually feels like he empties well, however.  IPSS 7/2.  He does drink 2 to 3 cups of coffee a day.   Past Medical History:  Past Medical History:  Diagnosis Date   Cancer of appendix (HCC)    Coronary artery disease    Crohn disease (HCC)    Dyslipidemia    GERD (gastroesophageal reflux disease)    Herpes simplex    Hypertension    Non Hodgkin's lymphoma (HCC)    Peripheral vascular disease (HCC)    Left hand ischemia, Left hand small vascular disease, Left ulnar aneurysm with distal occlusion   Raynaud's disease     Past Surgical History:  Past Surgical History:  Procedure Laterality Date   ARCH AORTOGRAM  02/22/2015   Procedure: Arch Aortogram;  Surgeon: Chuck Hint, MD;  Location: [facility] INVASIVE CV LAB;  Service: Cardiovascular;;   COLONOSCOPY  2018   EXPLORATORY LAPAROTOMY     Small intestine surgery    LEFT HEART CATH AND CORONARY ANGIOGRAPHY N/A 04/11/2021   Procedure: LEFT HEART CATH AND CORONARY ANGIOGRAPHY;  Surgeon: Kathleene Hazel, MD;  Location: [facility] INVASIVE CV LAB;  Service: Cardiovascular;  Laterality: N/A;   PERIPHERAL VASCULAR CATHETERIZATION  Left 02/22/2015   Procedure: Upper Extremity Angiography;  Surgeon: Chuck Hint, MD;  Location: [facility] INVASIVE CV LAB;  Service: Cardiovascular;  Laterality: Left;   REFRACTIVE SURGERY     SHOULDER ARTHROSCOPY W/ SUPERIOR LABRAL ANTERIOR POSTERIOR REPAIR     ULTRASOUND GUIDANCE FOR VASCULAR ACCESS Right 02/22/2015   Procedure: Ultrasound Guidance For Vascular Access;  Surgeon: Chuck Hint, MD;  Location: [facility] INVASIVE CV LAB;  Service: Cardiovascular;  Laterality: Right;    Allergies:  No Known Allergies  Family History:  Family History  Problem Relation Age of Onset   Cancer Mother    Heart disease Mother    Hyperlipidemia Mother    Hypertension Mother    Heart disease Father    Hyperlipidemia Father    Hypertension Father    Hypertension Brother     Social History:  Social History   Tobacco Use   Smoking status: Former    Types: Cigarettes    Start date: 02/16/2013   Smokeless tobacco: Never  Vaping Use   Vaping status: Never Used  Substance Use Topics   Alcohol use: Yes    Alcohol/week: 3.0 - 6.0 standard drinks of alcohol    Types: 1 - 2 Glasses of wine, 1 - 2 Cans of beer, 1 - 2 Shots of liquor per week   Drug use: Yes    Types: Marijuana    Review of  symptoms:  Constitutional:  Negative for unexplained weight loss, night sweats, fever, chills ENT:  Negative for nose bleeds, sinus pain, painful swallowing CV:  Negative for chest pain, shortness of breath, exercise intolerance, palpitations, loss of consciousness Resp:  Negative for cough, wheezing, shortness of breath GI:  Negative for nausea, vomiting, diarrhea, bloody stools GU:  Positives noted in HPI; otherwise negative for gross hematuria, dysuria, urinary incontinence Neuro:  Negative for seizures, poor balance, limb weakness, slurred speech Psych:  Negative for lack of energy, depression, anxiety Endocrine:  Negative for polydipsia, polyuria, symptoms of hypoglycemia (dizziness, hunger,  sweating) Hematologic:  Negative for anemia, purpura, petechia, prolonged or excessive bleeding, use of anticoagulants  Allergic:  Negative for difficulty breathing or choking as a result of exposure to anything; no shellfish allergy; no allergic response (rash/itch) to materials, foods  Physical exam: There were no vitals taken for this visit. GENERAL APPEARANCE:  Well appearing, well developed, well nourished, NAD HEENT: Atraumatic, Normocephalic. NECK: Normal appearance LUNGS: Normal inspiratory and expiratory excursion HEART: Regular Rate ABDOMEN: No inguinal hernias GU: Phallus normal, no lesions. Scrotal skin normal. Testicles/epididymal structures normal. Meatus normal. Normal anal sphincter tone, prostate 20 mL, symmetric, non nodular, non tender.  External hemorrhoid noted. EXTREMITIES: Moves all extremities well.  Without clubbing, cyanosis, or edema. NEUROLOGIC:  Alert and oriented x 3, normal gait, CN II-XII grossly intact.  MENTAL STATUS:  Appropriate. SKIN:  Warm, dry and intact.    Results: Most recent PCP note reviewed.  I have reviewed urinalysis  I have reviewed PSA results--most recently 0.37  Testosterone levels reviewed--876  Most recent CMP/CBC reviewed  I have reviewed bladder scan  IPSS reviewed   Assessment: Lower urinary tract symptoms, perhaps exacerbated by testosterone repletion as well as caffeine.  He empties well, urinalysis is clear   Plan: I have reassured him about his prostate exam as well as his symptoms  I discussed moderating caffeine intake, also let him know that perhaps alpha-blocker management can help, although I do not think he really needs treatment at this time  He is reassured, he will come back as needed

## 2023-12-17 ENCOUNTER — Encounter: Payer: Self-pay | Admitting: Urology

## 2023-12-17 ENCOUNTER — Ambulatory Visit: Payer: Managed Care, Other (non HMO) | Admitting: Urology

## 2023-12-17 VITALS — BP 141/91 | HR 98 | Ht 71.0 in | Wt 218.0 lb

## 2023-12-17 DIAGNOSIS — R3911 Hesitancy of micturition: Secondary | ICD-10-CM

## 2023-12-17 DIAGNOSIS — E291 Testicular hypofunction: Secondary | ICD-10-CM | POA: Diagnosis not present

## 2023-12-17 LAB — URINALYSIS, ROUTINE W REFLEX MICROSCOPIC
Bilirubin, UA: NEGATIVE
Glucose, UA: NEGATIVE
Ketones, UA: NEGATIVE
Leukocytes,UA: NEGATIVE
Nitrite, UA: NEGATIVE
Protein,UA: NEGATIVE
RBC, UA: NEGATIVE
Specific Gravity, UA: <1.005 — ABNORMAL LOW (ref 1.005–1.030)
Urobilinogen, Ur: 0.2 mg/dL (ref 0.2–1.0)
pH, UA: 5.5 (ref 5.0–7.5)

## 2023-12-17 LAB — BLADDER SCAN AMB NON-IMAGING

## 2024-12-10 ENCOUNTER — Ambulatory Visit (HOSPITAL_BASED_OUTPATIENT_CLINIC_OR_DEPARTMENT_OTHER): Admitting: Cardiovascular Disease
